# Patient Record
Sex: Female | Born: 1955
Health system: Southern US, Community
[De-identification: ages and names within clinical notes are randomized; demographics above are authoritative.]

## PROBLEM LIST (undated history)

## (undated) DIAGNOSIS — R7611 Nonspecific reaction to tuberculin skin test without active tuberculosis: Secondary | ICD-10-CM

## (undated) DIAGNOSIS — N83201 Unspecified ovarian cyst, right side: Secondary | ICD-10-CM

## (undated) DIAGNOSIS — J449 Chronic obstructive pulmonary disease, unspecified: Secondary | ICD-10-CM

## (undated) DIAGNOSIS — J329 Chronic sinusitis, unspecified: Secondary | ICD-10-CM

## (undated) DIAGNOSIS — I10 Essential (primary) hypertension: Secondary | ICD-10-CM

---

## 1998-07-02 ENCOUNTER — Other Ambulatory Visit: Admission: RE | Admit: 1998-07-02 | Discharge: 1998-07-02 | Payer: Self-pay | Admitting: *Deleted

## 2000-08-17 ENCOUNTER — Other Ambulatory Visit: Admission: RE | Admit: 2000-08-17 | Discharge: 2000-08-17 | Payer: Self-pay | Admitting: Internal Medicine

## 2000-10-06 ENCOUNTER — Encounter (INDEPENDENT_AMBULATORY_CARE_PROVIDER_SITE_OTHER): Payer: Self-pay | Admitting: Specialist

## 2000-10-06 ENCOUNTER — Other Ambulatory Visit: Admission: RE | Admit: 2000-10-06 | Discharge: 2000-10-06 | Payer: Self-pay | Admitting: *Deleted

## 2001-03-12 ENCOUNTER — Ambulatory Visit (HOSPITAL_COMMUNITY): Admission: RE | Admit: 2001-03-12 | Discharge: 2001-03-12 | Payer: Self-pay | Admitting: Internal Medicine

## 2001-03-12 ENCOUNTER — Encounter: Payer: Self-pay | Admitting: Internal Medicine

## 2001-09-30 ENCOUNTER — Other Ambulatory Visit: Admission: RE | Admit: 2001-09-30 | Discharge: 2001-09-30 | Payer: Self-pay | Admitting: *Deleted

## 2001-10-05 ENCOUNTER — Ambulatory Visit (HOSPITAL_COMMUNITY): Admission: RE | Admit: 2001-10-05 | Discharge: 2001-10-05 | Payer: Self-pay | Admitting: *Deleted

## 2001-10-27 ENCOUNTER — Encounter: Admission: RE | Admit: 2001-10-27 | Discharge: 2001-10-27 | Payer: Self-pay | Admitting: Infectious Diseases

## 2001-11-02 ENCOUNTER — Ambulatory Visit (HOSPITAL_COMMUNITY): Admission: RE | Admit: 2001-11-02 | Discharge: 2001-11-02 | Payer: Self-pay | Admitting: Infectious Diseases

## 2001-11-02 ENCOUNTER — Encounter: Payer: Self-pay | Admitting: Infectious Diseases

## 2001-11-10 ENCOUNTER — Ambulatory Visit: Admission: RE | Admit: 2001-11-10 | Discharge: 2001-11-10 | Payer: Self-pay | Admitting: Internal Medicine

## 2001-11-10 ENCOUNTER — Encounter (INDEPENDENT_AMBULATORY_CARE_PROVIDER_SITE_OTHER): Payer: Self-pay | Admitting: Specialist

## 2001-12-08 ENCOUNTER — Encounter: Admission: RE | Admit: 2001-12-08 | Discharge: 2001-12-08 | Payer: Self-pay | Admitting: Infectious Diseases

## 2002-01-19 ENCOUNTER — Encounter: Admission: RE | Admit: 2002-01-19 | Discharge: 2002-01-19 | Payer: Self-pay | Admitting: Infectious Diseases

## 2002-12-22 ENCOUNTER — Other Ambulatory Visit: Admission: RE | Admit: 2002-12-22 | Discharge: 2002-12-22 | Payer: Self-pay | Admitting: Internal Medicine

## 2006-03-03 ENCOUNTER — Ambulatory Visit: Payer: Self-pay | Admitting: Gastroenterology

## 2006-03-20 ENCOUNTER — Ambulatory Visit (HOSPITAL_COMMUNITY): Admission: RE | Admit: 2006-03-20 | Discharge: 2006-03-20 | Payer: Self-pay | Admitting: Obstetrics and Gynecology

## 2006-06-19 ENCOUNTER — Ambulatory Visit: Payer: Self-pay | Admitting: Gastroenterology

## 2006-09-28 ENCOUNTER — Other Ambulatory Visit: Admission: RE | Admit: 2006-09-28 | Discharge: 2006-09-28 | Payer: Self-pay | Admitting: Cardiology

## 2007-01-07 ENCOUNTER — Ambulatory Visit (HOSPITAL_COMMUNITY): Admission: RE | Admit: 2007-01-07 | Discharge: 2007-01-07 | Payer: Self-pay | Admitting: Internal Medicine

## 2008-08-11 ENCOUNTER — Ambulatory Visit (HOSPITAL_COMMUNITY): Admission: RE | Admit: 2008-08-11 | Discharge: 2008-08-11 | Payer: Self-pay | Admitting: Internal Medicine

## 2009-03-26 ENCOUNTER — Inpatient Hospital Stay (HOSPITAL_COMMUNITY): Admission: EM | Admit: 2009-03-26 | Discharge: 2009-03-27 | Payer: Self-pay | Admitting: Emergency Medicine

## 2009-05-03 ENCOUNTER — Ambulatory Visit: Admission: RE | Admit: 2009-05-03 | Discharge: 2009-05-03 | Payer: Self-pay | Admitting: Gynecologic Oncology

## 2009-05-04 ENCOUNTER — Other Ambulatory Visit: Payer: Self-pay | Admitting: Obstetrics and Gynecology

## 2010-02-14 ENCOUNTER — Other Ambulatory Visit: Admission: RE | Admit: 2010-02-14 | Discharge: 2010-02-14 | Payer: Self-pay | Admitting: Internal Medicine

## 2010-12-28 LAB — COMPREHENSIVE METABOLIC PANEL
ALT: 14 U/L (ref 0–35)
AST: 22 U/L (ref 0–37)
Albumin: 4.1 g/dL (ref 3.5–5.2)
Alkaline Phosphatase: 60 U/L (ref 39–117)
BUN: 9 mg/dL (ref 6–23)
CO2: 30 mEq/L (ref 19–32)
Calcium: 9.9 mg/dL (ref 8.4–10.5)
Chloride: 102 mEq/L (ref 96–112)
Creatinine, Ser: 0.78 mg/dL (ref 0.4–1.2)
GFR calc Af Amer: >60 mL/min (ref >60)
GFR calc non Af Amer: >60 mL/min (ref >60)
Glucose, Bld: 101 mg/dL — ABNORMAL HIGH (ref 70–99)
Potassium: 3.2 mEq/L — ABNORMAL LOW (ref 3.5–5.1)
Sodium: 138 mEq/L (ref 135–145)
Total Bilirubin: 1 mg/dL (ref 0.3–1.2)
Total Protein: 7.2 g/dL (ref 6.0–8.3)

## 2010-12-28 LAB — CBC
HCT: 43.6 % (ref 36.0–46.0)
Hemoglobin: 14.9 g/dL (ref 12.0–15.0)
MCHC: 34.2 g/dL (ref 30.0–36.0)
MCV: 93.7 fL (ref 78.0–100.0)
Platelets: 146 10*3/uL — ABNORMAL LOW (ref 150–400)
RBC: 4.65 MIL/uL (ref 3.87–5.11)
RDW: 12.8 % (ref 11.5–15.5)
WBC: 7.6 10*3/uL (ref 4.0–10.5)

## 2010-12-29 LAB — BASIC METABOLIC PANEL
BUN: 10 mg/dL (ref 6–23)
CO2: 24 mEq/L (ref 19–32)
Calcium: 8 mg/dL — ABNORMAL LOW (ref 8.4–10.5)
Chloride: 110 mEq/L (ref 96–112)
Creatinine, Ser: 0.68 mg/dL (ref 0.4–1.2)
GFR calc Af Amer: >60 mL/min (ref >60)
GFR calc non Af Amer: >60 mL/min (ref >60)
Glucose, Bld: 104 mg/dL — ABNORMAL HIGH (ref 70–99)
Potassium: 4 mEq/L (ref 3.5–5.1)
Sodium: 138 mEq/L (ref 135–145)

## 2010-12-29 LAB — DIFFERENTIAL
Basophils Absolute: 0.1 10*3/uL (ref 0.0–0.1)
Basophils Relative: 0 % (ref 0–1)
Eosinophils Absolute: 0.1 10*3/uL (ref 0.0–0.7)
Eosinophils Relative: 0 % (ref 0–5)
Lymphocytes Relative: 6 % — ABNORMAL LOW (ref 12–46)
Lymphs Abs: 1.4 10*3/uL (ref 0.7–4.0)
Monocytes Absolute: 0.8 10*3/uL (ref 0.1–1.0)
Monocytes Relative: 4 % (ref 3–12)
Neutro Abs: 21.6 10*3/uL — ABNORMAL HIGH (ref 1.7–7.7)
Neutrophils Relative %: 90 % — ABNORMAL HIGH (ref 43–77)

## 2010-12-29 LAB — URINE CULTURE
Colony Count: NO GROWTH
Culture: NO GROWTH

## 2010-12-29 LAB — CBC
HCT: 36.2 % (ref 36.0–46.0)
HCT: 47.8 % — ABNORMAL HIGH (ref 36.0–46.0)
Hemoglobin: 12.1 g/dL (ref 12.0–15.0)
Hemoglobin: 16.1 g/dL — ABNORMAL HIGH (ref 12.0–15.0)
MCHC: 33.5 g/dL (ref 30.0–36.0)
MCHC: 33.8 g/dL (ref 30.0–36.0)
MCV: 94.7 fL (ref 78.0–100.0)
MCV: 95.3 fL (ref 78.0–100.0)
Platelets: 144 10*3/uL — ABNORMAL LOW (ref 150–400)
Platelets: 243 10*3/uL (ref 150–400)
RBC: 3.79 MIL/uL — ABNORMAL LOW (ref 3.87–5.11)
RBC: 5.05 MIL/uL (ref 3.87–5.11)
RDW: 13 % (ref 11.5–15.5)
RDW: 13.2 % (ref 11.5–15.5)
WBC: 24.1 10*3/uL — ABNORMAL HIGH (ref 4.0–10.5)
WBC: 6.6 10*3/uL (ref 4.0–10.5)

## 2010-12-29 LAB — POCT I-STAT, CHEM 8
BUN: 7 mg/dL (ref 6–23)
Calcium, Ion: 1.11 mmol/L — ABNORMAL LOW (ref 1.12–1.32)
Chloride: 97 mEq/L (ref 96–112)
Creatinine, Ser: 0.8 mg/dL (ref 0.4–1.2)
Glucose, Bld: 100 mg/dL — ABNORMAL HIGH (ref 70–99)
HCT: 53 % — ABNORMAL HIGH (ref 36.0–46.0)
Hemoglobin: 18 g/dL — ABNORMAL HIGH (ref 12.0–15.0)
Potassium: 3 mEq/L — ABNORMAL LOW (ref 3.5–5.1)
Sodium: 136 mEq/L (ref 135–145)
TCO2: 31 mmol/L (ref 0–100)

## 2010-12-29 LAB — STREP A DNA PROBE: Group A Strep Probe: NEGATIVE

## 2010-12-29 LAB — WET PREP, GENITAL
Clue Cells Wet Prep HPF POC: NONE SEEN
Trich, Wet Prep: NONE SEEN
WBC, Wet Prep HPF POC: NONE SEEN

## 2010-12-29 LAB — COMPREHENSIVE METABOLIC PANEL
ALT: 17 U/L (ref 0–35)
AST: 29 U/L (ref 0–37)
Albumin: 4.2 g/dL (ref 3.5–5.2)
Alkaline Phosphatase: 76 U/L (ref 39–117)
BUN: 7 mg/dL (ref 6–23)
CO2: 30 mEq/L (ref 19–32)
Calcium: 9.7 mg/dL (ref 8.4–10.5)
Chloride: 99 mEq/L (ref 96–112)
Creatinine, Ser: 0.72 mg/dL (ref 0.4–1.2)
GFR calc Af Amer: >60 mL/min (ref >60)
GFR calc non Af Amer: >60 mL/min (ref >60)
Glucose, Bld: 104 mg/dL — ABNORMAL HIGH (ref 70–99)
Potassium: 3.2 mEq/L — ABNORMAL LOW (ref 3.5–5.1)
Sodium: 138 mEq/L (ref 135–145)
Total Bilirubin: 1 mg/dL (ref 0.3–1.2)
Total Protein: 7.8 g/dL (ref 6.0–8.3)

## 2010-12-29 LAB — PROTIME-INR
INR: 0.9 (ref 0.00–1.49)
Prothrombin Time: 12.6 seconds (ref 11.6–15.2)

## 2010-12-29 LAB — LIPASE, BLOOD: Lipase: 32 U/L (ref 11–59)

## 2010-12-29 LAB — URINALYSIS, ROUTINE W REFLEX MICROSCOPIC
Bilirubin Urine: NEGATIVE
Glucose, UA: NEGATIVE mg/dL
Hgb urine dipstick: NEGATIVE
Ketones, ur: NEGATIVE mg/dL
Nitrite: NEGATIVE
Protein, ur: NEGATIVE mg/dL
Specific Gravity, Urine: 1.009 (ref 1.005–1.030)
Urobilinogen, UA: 0.2 mg/dL (ref 0.0–1.0)
pH: 7 (ref 5.0–8.0)

## 2010-12-29 LAB — GC/CHLAMYDIA PROBE AMP, GENITAL
Chlamydia, DNA Probe: NEGATIVE
GC Probe Amp, Genital: NEGATIVE

## 2010-12-29 LAB — RAPID STREP SCREEN (MED CTR MEBANE ONLY): Streptococcus, Group A Screen (Direct): NEGATIVE

## 2010-12-29 LAB — MAGNESIUM: Magnesium: 2.1 mg/dL (ref 1.5–2.5)

## 2010-12-29 LAB — PREGNANCY, URINE: Preg Test, Ur: NEGATIVE

## 2011-02-04 NOTE — Consult Note (Signed)
NAME:  Stephanie [name], Stephanie [name] NO.:  [id number]   MEDICAL RECORD NO.:  [id number]          PATIENT TYPE:  OUT   LOCATION:  GYN                          FACILITY:  [facility]   PHYSICIAN:  Laurette Schimke, MD     DATE OF BIRTH:  Nov 13, 1955   DATE OF CONSULTATION:  DATE OF DISCHARGE:                                 CONSULTATION   REASON FOR VISIT:  A request was made by Dr. Arelia Sneddon for Ms. Arijana Narayan to be seen in consultation for a pelvic mass.   HISTORY OF PRESENT ILLNESS:  This is a 55 year old gravida 2, para 2.  Last normal menstrual period March 31, 2009.  Stephanie [name] experienced very  severe crampy lower abdominal pain which she says diffusely involved the  stomach, was associated with nausea, a significant amount of clamminess  and the urge to vomit.  She presented to the emergency room, at which  time a CT scan was obtained.  The CT scan was obtained which showed a  right large cystic lesion measuring 5.6 x 4.01, and a left ovarian cyst  measuring 19 x 15 mm.  The loops of the bowel were remarkable.  No other  intrapelvic abnormalities were noted.  An ultrasound was obtained that  same visit which showed that the left ovary had a simple ovarian cyst,  and there was a possible hemorrhagic cyst or endometrium of the right  ovary.  Stephanie [name] reports an 8 pounds weight loss over the last year.  She states that her appetite is poor.  This is in large part associated  with her irregular work schedule.  She denies any diarrhea or  constipation.  There is no early satiety or bloating.  There is no  shortness of breath.   PAST MEDICAL HISTORY:  Hypertension of 6 years duration.   PAST SURGICAL HISTORY:  None.   PAST GYNECOLOGIC HISTORY:  Menarche occurred at the age of 73 with  regular menses until recently when it became irregular.  She reports a  history of Depo-Provera use for 4 years' duration, but she stopped using  it 5 years ago.   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  She works the second shift.  She reports one  pack of tobacco use daily for the last 3 years.   MEDICATIONS:  1. Maxzide.  2. Potassium.   FAMILY HISTORY:  Father had a benign breast cyst.  Sister also with a  benign breast cyst.  She denies any gynecologic, color or other  malignancies.   REVIEW OF SYSTEMS:  A 10-poing review of systems was performed and  noteworthy only for those points mentioned in the history of present  illness.   PHYSICAL EXAMINATION:  VITAL SIGNS:  Weight 99 pounds, height 4 feet 11  inches, blood pressure 120/66.  CHEST:  Clear to auscultation bilaterally.  HEART:  Regular rate and rhythm.  ABDOMEN:  Soft, nontender, nondistended.  No omental cake.  No fluid  wave.  PELVIC:  Normal-appearing genitalia, Bartholin's urethra, Skene's.  Uterus was retroverted.  There is fullness in the left the area of the  pelvis.   IMPRESSION:  Pelvic mass.  This is a 55 year old who presented with  symptoms which are not inconsistent with ovarian torsion who was on the  first day of menses.  A CA-125 was obtained, which was minimally  elevated at a level of 37.6 (upper level of normal per this lab is  30.2).  I suggested to Ms. [name] that perhaps a repeat ultrasound could  be obtained, and if the mass is still present, then laparoscopic  salpingo-oophorectomy could be indicated.  It is approximately 6 weeks  since the incident, so this should represent a different phase of the  menstrual cycle.  Stephanie [name] informs me that she is scheduled for an  ultrasound on May 04, 2009, but there was also the plan for surgical  intervention on Monday, May 07, 2009, since she wishes definitive  management.      Laurette Schimke, MD  Electronically Signed     WB/MEDQ  D:  05/03/2009  T:  05/03/2009  Job:  [id number]   cc:   Telford Nab, R.N.  [address]  [place], [place] [address]   Juluis Mire,  M.D.  Fax: [phone]

## 2011-02-04 NOTE — H&P (Signed)
NAME:  Stephanie [name], Stephanie [name] NO.:  [id number]   MEDICAL RECORD NO.:  [id number]          PATIENT TYPE:  EMS   LOCATION:  ED                           FACILITY:  [facility]   PHYSICIAN:  Vania Rea, M.D. DATE OF BIRTH:  [birth year]   DATE OF ADMISSION:  03/26/2009  DATE OF DISCHARGE:                              HISTORY & PHYSICAL   PRIMARY CARE PHYSICIAN:  Dr. Georgianne Fick   GYNECOLOGIST:  Dr. Richardean Chimera   CHIEF COMPLAINT:  Abdominal pain.   HISTORY OF PRESENT ILLNESS:  This is a 55 year old Asian lady who was  out in the mall when she was suddenly overcome by 10/10 periumbilical  pain which did not radiate.  There was no associated nausea or vomiting.  She is sexually active, menstruation is regular and her menstruation  started the day prior to onset of the abdominal pain.  She is having no  vaginal discharge and no dyspareunia and no dysmenorrhea.  The patient  came to the emergency room and is extensively evaluated for pelvic pain  including CT scans of the abdomen and pelvis, ultrasound of the abdomen  and pelvis, and no clear cause for the patient's pain could be  identified.  Additionally, the patient was found to have white count of  24,000 with 90% neutrophils, which could not be explained and the  hospitalist service was called to assist with management.   On further questioning, the patient admits that she has been having a  sore throat and a postnasal drip and she has been taking Ceftin 500 mg  twice daily, now for about 5-1/2 days.  She has been having no fever.  Throat is improving.  For the 9 hours the patient has been in the  emergency room, her abdominal pain has improved significantly.   PAST MEDICAL HISTORY:  Hypertension.   MEDICATIONS:  Maxzide one daily, potassium chloride 20 mEq daily, Ceftin  500 mg twice daily for the past 5 days.   ALLERGIES:  NO KNOWN DRUG ALLERGIES.   SOCIAL HISTORY:  She smokes one-pack per day.  Denies  alcohol or illicit  drug use.   REVIEW OF SYSTEMS:  Other than noted above, 10-point review of systems  is unremarkable.   PHYSICAL EXAMINATION:  GENERAL:  A healthy-looking Asian lady lying in  bed in no acute distress.  VITALS:  Temperature is 98.2, pulse 80, respirations 18, blood pressure  120/77.  She is saturating at 98% on room air.  HEENT:  Her pupils are round, equal and reactive.  Mucous membranes pink  and anicteric.  No cervical lymphadenopathy or thyromegaly.  She has no  oral Candida.  She has a creamy white exudate over her tonsils and  pharynx.  Her chest is clear to auscultation bilaterally.  CARDIOVASCULAR SYSTEM:  Regular rhythm.  No murmur.  ABDOMEN:  Scaphoid and soft.  She has tenderness to deep palpation in  the right lower quadrant and the left anterior flank  EXTREMITIES:  Without edema.  She has 2+ dorsalis pedis pulses  bilaterally.  SKIN:  Warm and dry without ulceration.  CENTRAL NERVOUS  SYSTEM: Cranial nerves II-XII are grossly intact.  She  has no focal neurologic deficit.   LABORATORY DATA:  White count 24.1, hemoglobin 16.1, platelets 243.  She  had 9% neutrophils.  Absolute neutrophil count was 21.6.  Sodium is 138,  potassium 3.2, chloride 99, pO2 30, glucose 104, BUN 7, creatinine 0.7.  Liver functions are completely normal.  Lipase is normal.  Her coags are  normal.  Urinalysis is completely normal.  Pregnancy test is negative.  Wet prep from her vaginal smear shows many yeast.  No Trichomonas.  No  clue cells, no Y cells.  CT scan of the abdomen and pelvis revealed  minimal patchiness of the right renal cortex on delayed images, could be  due to is subtle pyelonephritis, recommend correlation with urinalysis.  CT scan of the pelvis shows bilateral adnexal cystic structures, likely  ovarian cyst, no intrapelvic abnormality identified.  Ultrasound of her  abdomen shows simple cyst of the left ovary, questions hemorrhagic cyst,  although  potentially endometrioma of the right ovary.  Recommend  ultrasound after two menstrual cycles to exclude ovarian tumor or  neoplasm.   ASSESSMENT:  1. Leukocytosis probably due to incompletely treated pharyngitis and      sinusitis, possibly also due to incompletely treated pyelonephritis      for which could be causing negative urinalysis.  2. Pelvic and abdominal pain of unclear etiology.  3. Hypokalemia related to diuretic use.  4. Hypertension controlled.  5. History of change in stool pattern with a planned colonoscopy in      one month.   PLAN:  1. Will admit this lady for intravenous antibiotics therapy to cover      her pharyngitis and presumed pyelonephritis, incompletely treated.  2. Will replete her potassium.  3. The patient can likely be quickly transitioned to oral antibiotics      and discharged to follow-up with her gynecologist or primary care      physician's.  4. Other plans as per orders.      Vania Rea, M.D.  Electronically Signed     LC/MEDQ  D:  03/26/2009  T:  03/26/2009  Job:  [id number]   cc:   Rhae Lerner. Margretta Ditty, M.D.  [address]  [place]  [place] [address]   Deanna Artis, MD   Juluis Mire, M.D.  Fax: [phone]   Georgianne Fick, M.D.  Fax: [phone]

## 2011-02-04 NOTE — Discharge Summary (Signed)
NAMEYULI, [name]              ACCOUNT NO.:  [id number]   MEDICAL RECORD NO.:  [id number]          PATIENT TYPE:  INP   LOCATION:  1308                         [facility]AC[facility]:  [facility]   PHYSICIAN:  Peggye Pitt, M.D. DATE OF BIRTH:  [birth year]   DATE OF ADMISSION:  03/25/2009  DATE OF DISCHARGE:  03/27/2009                               DISCHARGE SUMMARY   DISCHARGE DIAGNOSES:  1. Abdominal pain, resolved.  2. Right ovarian cyst, possibly hemorrhagic.  3. Leukocytosis, resolved.  4. Acute sinusitis.  5. Hypertension.   DISCHARGE MEDICATIONS:  1. Amoxicillin 500 mg twice daily for 7 days.  2. Maxzide 1 tablet daily.  3. KCl 20 mEq daily.   DISPOSITION/FOLLOWUP:  [name] is discharged home today in stable  condition.  She is instructed to call her gynecologist, Dr. Arelia Sneddon for a  follow-up appointment.   CONSULTATIONS THIS HOSPITALIZATION:  None.   IMAGES AND PROCEDURES:  1. An abdominal x-ray on March 25, 2009 that showed minimal ileus.  2. A CT scan of the abdomen and pelvis with contrast on March 26, 2009      that showed bilateral adnexal cystic structures, 1.9 cm on the      left, and 5.6 cm on the right, likely ovarian cyst, minimal      dependent free pelvic fluid.  3. A transvaginal ultrasound that showed a small simple cyst of the      left ovary, a question hemorrhagic cyst or potentially endometrioma      of the right ovary, favor hemorrhagic cyst, recommend follow-up      ultrasound after two menstrual cycles to exclude ovarian      tumor/neoplasm.   HISTORY AND PHYSICAL EXAM:  For full details please see dictation by Dr.  Orvan Falconer on March 26, 2009, but in brief, Stephanie [name] is a pleasant 55-year-  old woman of a Filipino ethnicity who was at the mall when she suddenly  had periumbilical abdominal pain that did not radiate. She denies  dyspareunia and dysmenorrhea, is not having any vaginal discharge.  In  the emergency room a CT scan of the abdomen and pelvis  showed possibly a  right ovarian cyst.  She was also found to have a leukocytosis of 24,000  and hence we were called to admit for further evaluation and management.   HOSPITAL COURSE:  1. Her abdominal pain.  This has currently resolved.  We have found no      cause for it other than possibly her right ovarian cyst, although I      doubt this would cause a periumbilical pain.  Her appendix was      normal on CT scan.  2. Leukocytosis which has now resolved.  Her WBC count is 6.6 today.      I have question if this was a lab error versus possibly her acute      sinusitis.  3. For her acute sinusitis, she has very erythematous oropharynx and      has lot of nasal congestion.  Hence, I will give her amoxicillin to      complete  a 7-day course.  4. Hypertension.  She has maintained excellent blood pressure control      while in the hospital.  5. Vital signs on day of discharge; blood pressure 108/67, heart rate      56, respirations 20, O2 sats 98% on room air with a temperature of      98.2.  6. Labs on day of discharge; sodium 138, potassium 4.0, chloride 110,      bicarb 24, BUN 10, creatinine 0.68, glucose of 104. A magnesium of      2.1. WBC 6.6, hemoglobin 12.1, platelet count of 144. Please note      that both rapid strep test and a group-A throat probe have been      negative.  Mrs. Mooradian also had a pelvic exam in the emergency      department with a wet prep that showed many yeast. She has received      2 days of Diflucan while in the hospital.      Peggye Pitt, M.D.  Electronically Signed     EH/MEDQ  D:  03/27/2009  T:  03/27/2009  Job:  [id number]   cc:   Juluis Mire, M.D.  Fax: [phone]

## 2011-02-07 NOTE — Procedures (Signed)
[place]  Patient:    Stephanie [name], Stephanie [name] Visit Number: [id number] MRN: [id number]          Service Type: OUT Location: RAD Attending Physician:  Noland Fordyce Dictated by:   Charlaine Dalton. Sherene Sires, M.D. [facility] Proc. Date: 11/10/01 Admit Date:  10/05/2001 Discharge Date: 10/05/2001                             Procedure Report  PROCEDURE PERFORMED:  Fiberoptic bronchoscopy with transbronchial biopsy of the right upper lobe.  REFERRING PHYSICIAN:  Lacretia Leigh. Ninetta Lights, M.D.  ENDOSCOPIST:  Charlaine Dalton. Wert, M.D. [facility]  INDICATIONS FOR PROCEDURE:  This is a 55 year old philippina female with new conversion, positive PPD conversion and a vague infiltrate of her right upper lobe, nodular infiltration of her right upper lobe.  It is barely visible on chest x-ray and more prominent on CT scan.  The differential diagnosis is between malignancy and tuberculosis.  The patient was not producing any significant sputum and therefore bronchoscopy is indicated to obtain tissue as well as lavage for AFB, fungal stain and culture.  The patient agreed to the procedure after a full discussion of the risks, benefits and alternatives.  She received a total of 5 mg of IV Versed and 25 mg of IV Demerol with adequate sedation and cough suppression while being continuously monitored by surface echocardiogram and oximetry.  She maintained greater than 90% saturation throughout the procedure on nasal oxygen by nasal prongs.  The right naris and oropharynx were liberally anesthetized with 1% lidocaine aerosol.  The right naris was additionally prepared with 2% lidocaine jelly.  Using a standard flexible fiberoptic video bronchoscope, the right naris was easily cannulated with good visualization of the entire oropharynx and larynx. The cords moved normally and there were no apparent upper airway lesions.  Using additional 1% lidocaine as needed, the entire tracheobronchial tree  was explored bilaterally with the following findings.  (1) Trachea, carina and all the airways opened widely to the subsegmental level with no evidence of significant abnormalities.  There was mild dynamic compression of the airways distally but no evidence of endobronchial lesions or excessive secretions or inflammation.  Therefore, using a wedge position within the right upper lobe, several subsegments of the right upper lobe, I was able to access the tissue, sampling the tissue but there was no infiltrate to be seen on fluoroscopy.  From the area of infiltrate corresponded to the CT scan.  There was minimal bleeding encountered.  Additionally, the right upper lobe was generously lavaged with adequate return.  IMPRESSION:  Nodular density right upper lobe, question etiology.  Will await the histology and special stains as indicated.  Additionally, the right upper lobe was lavaged for cytology, acid fast bacillus and fungal stain and culture.  The patient tolerated the procedure well.  Follow-up chest x-ray showed no evidence of pneumothorax but increased alveolar markings (probably consistent with the effects of biopsy with alveolar hemorrhage).  The patient will be observed until she is saturating well on room air and then discharged home. Dictated by:   Charlaine Dalton. Sherene Sires, M.D. [facility] Attending Physician:  Noland Fordyce DD:  11/10/01 TD:  11/10/01 Job: [id number]

## 2011-11-26 ENCOUNTER — Other Ambulatory Visit (HOSPITAL_COMMUNITY): Payer: Self-pay | Admitting: Obstetrics and Gynecology

## 2011-11-26 DIAGNOSIS — Z139 Encounter for screening, unspecified: Secondary | ICD-10-CM

## 2011-12-02 ENCOUNTER — Ambulatory Visit (HOSPITAL_COMMUNITY): Payer: Self-pay

## 2011-12-04 ENCOUNTER — Ambulatory Visit (HOSPITAL_COMMUNITY)
Admission: RE | Admit: 2011-12-04 | Discharge: 2011-12-04 | Disposition: A | Discharge status: Home / Self Care | Payer: Managed Care, Other (non HMO) | Source: Ambulatory Visit | Attending: Obstetrics and Gynecology | Admitting: Obstetrics and Gynecology

## 2011-12-04 DIAGNOSIS — Z1231 Encounter for screening mammogram for malignant neoplasm of breast: Secondary | ICD-10-CM | POA: Insufficient documentation

## 2011-12-04 DIAGNOSIS — Z139 Encounter for screening, unspecified: Secondary | ICD-10-CM

## 2012-02-04 ENCOUNTER — Other Ambulatory Visit: Payer: Self-pay | Admitting: Obstetrics and Gynecology

## 2014-08-09 ENCOUNTER — Other Ambulatory Visit: Payer: Self-pay | Admitting: Obstetrics and Gynecology

## 2014-08-14 LAB — CYTOLOGY - PAP

## 2019-02-10 ENCOUNTER — Other Ambulatory Visit: Payer: Self-pay | Admitting: Internal Medicine

## 2019-02-10 DIAGNOSIS — N83209 Unspecified ovarian cyst, unspecified side: Secondary | ICD-10-CM

## 2019-03-07 ENCOUNTER — Ambulatory Visit
Admission: RE | Admit: 2019-03-07 | Discharge: 2019-03-07 | Disposition: A | Discharge status: Home / Self Care | Payer: 59 | Source: Ambulatory Visit | Attending: Internal Medicine | Admitting: Internal Medicine

## 2019-03-07 DIAGNOSIS — N83209 Unspecified ovarian cyst, unspecified side: Secondary | ICD-10-CM

## 2019-08-22 ENCOUNTER — Other Ambulatory Visit: Payer: Self-pay

## 2019-08-22 ENCOUNTER — Encounter (HOSPITAL_BASED_OUTPATIENT_CLINIC_OR_DEPARTMENT_OTHER): Payer: Self-pay | Admitting: *Deleted

## 2019-08-22 NOTE — Progress Notes (Addendum)
Spoke w/ via phone for pre-op interview---Stephanie [name] needs dos----    Cbc, bmet, ekg          [name] results------ COVID test ------+ covid test 05-30-2019 [place] home, results on chart Arrive at -------530 am NPO after ------midnight Medications to take morning of surgery -----none Diabetic medication -----n/a Patient Special Instructions ----- Pre-Op special Istructions ----- Patient verbalized understanding of instructions that were given at this phone interview. Patient denies shortness of breath, chest pain, fever, cough a this phone interview.

## 2019-08-23 ENCOUNTER — Other Ambulatory Visit (HOSPITAL_COMMUNITY)
Admission: RE | Admit: 2019-08-23 | Discharge: 2019-08-23 | Disposition: A | Discharge status: Home / Self Care | Payer: 59 | Source: Ambulatory Visit | Attending: Obstetrics and Gynecology | Admitting: Obstetrics and Gynecology

## 2019-08-24 NOTE — H&P (Signed)
Stephanie [name] is an 63 y.o. female. She had a pelvic ultrasound in June to f/u an ovarian cyst seen in 2010.  Ultrasound with 6 cm complex cyst of right ovary-so significant change from 10 years prior, Ca-125 elevated to 73.2.  Ultrasound also with possible endometrial polyp.  She has no significant pain from this cyst, no recent vaginal bleeding.  Due to the size of the cyst and elevated Ca-125, I have recommended the ovary be removed although likely benign.  Pertinent Gynecological History: Last mammogram: normal Date: 04/2019 Last pap: ASCUS, neg HPV Date: 2017 OB History: G2, P2002 SVD x 2   Menstrual History: No LMP recorded. Patient is postmenopausal.    Past Medical History:  Diagnosis Date  . COPD (chronic obstructive pulmonary disease) ([place])    quit smoking 8 yrs ago  . Hypertension   . Positive TB test many yrs ago  . Right ovarian cyst   . Sinusitis    occ    Past Surgical History:  Procedure Laterality Date  . bronscopy  2000  . colonscopy  2013    History reviewed. No pertinent family history.  Social History:  reports that she has quit smoking. Her smoking use included cigarettes. She has a 50.00 pack-year smoking history. She has never used smokeless tobacco. She reports current alcohol use. She reports that she does not use drugs.  Allergies: No Known Allergies  No medications prior to admission.    Review of Systems  Respiratory: Negative.   Cardiovascular: Negative.     Height 5' (1.524 m), weight 48.5 kg. Physical Exam  Constitutional: She appears well-developed and well-nourished.  Neck: Neck supple. No thyromegaly present.  Cardiovascular: Normal rate, regular rhythm and normal heart sounds.  Respiratory: Effort normal and breath sounds normal. No respiratory distress. She has no wheezes.  GI: Soft. She exhibits no distension and no mass. There is no abdominal tenderness.  Genitourinary:    Vagina and uterus normal.     Genitourinary Comments:  Right adnexal fullness     No results found for this or any previous visit (from the past 24 hour(s)).  No results found.  Assessment/Plan: 6 cm complex right ovarian cyst, stable over 10 years by u/s, but with elevated Ca-125, also with probable endometrial polyp.  Discussed options, recommended surgical removal of ovary and hysteroscopy and possible polypectomy.  Surgical procedure and risks discussed.  Will admit for open laparoscopy, RSO, hysteroscopy and possible polypectomy.  Blane Ohara Kadajah Kjos 08/24/2019, 5:44 PM

## 2019-08-26 ENCOUNTER — Ambulatory Visit (HOSPITAL_BASED_OUTPATIENT_CLINIC_OR_DEPARTMENT_OTHER): Payer: 59 | Admitting: Anesthesiology

## 2019-08-26 ENCOUNTER — Other Ambulatory Visit: Payer: Self-pay

## 2019-08-26 ENCOUNTER — Ambulatory Visit (HOSPITAL_BASED_OUTPATIENT_CLINIC_OR_DEPARTMENT_OTHER)
Admission: RE | Admit: 2019-08-26 | Discharge: 2019-08-26 | Disposition: A | Discharge status: Home / Self Care | Payer: 59 | Attending: Obstetrics and Gynecology | Admitting: Obstetrics and Gynecology

## 2019-08-26 ENCOUNTER — Encounter (HOSPITAL_BASED_OUTPATIENT_CLINIC_OR_DEPARTMENT_OTHER)
Admission: RE | Disposition: A | Discharge status: Home / Self Care | Payer: Self-pay | Source: Home / Self Care | Attending: Obstetrics and Gynecology

## 2019-08-26 ENCOUNTER — Encounter (HOSPITAL_BASED_OUTPATIENT_CLINIC_OR_DEPARTMENT_OTHER): Payer: Self-pay | Admitting: *Deleted

## 2019-08-26 DIAGNOSIS — I10 Essential (primary) hypertension: Secondary | ICD-10-CM | POA: Insufficient documentation

## 2019-08-26 DIAGNOSIS — K660 Peritoneal adhesions (postprocedural) (postinfection): Secondary | ICD-10-CM | POA: Insufficient documentation

## 2019-08-26 DIAGNOSIS — D270 Benign neoplasm of right ovary: Secondary | ICD-10-CM | POA: Diagnosis not present

## 2019-08-26 DIAGNOSIS — N83201 Unspecified ovarian cyst, right side: Secondary | ICD-10-CM | POA: Diagnosis present

## 2019-08-26 DIAGNOSIS — Z87891 Personal history of nicotine dependence: Secondary | ICD-10-CM | POA: Insufficient documentation

## 2019-08-26 DIAGNOSIS — J449 Chronic obstructive pulmonary disease, unspecified: Secondary | ICD-10-CM | POA: Insufficient documentation

## 2019-08-26 HISTORY — DX: Essential (primary) hypertension: I10

## 2019-08-26 HISTORY — PX: LAPAROSCOPIC UNILATERAL SALPINGO OOPHERECTOMY: SHX5935

## 2019-08-26 HISTORY — PX: HYSTEROSCOPY: SHX211

## 2019-08-26 HISTORY — DX: Chronic sinusitis, unspecified: J32.9

## 2019-08-26 HISTORY — DX: Unspecified ovarian cyst, right side: N83.201

## 2019-08-26 HISTORY — DX: Chronic obstructive pulmonary disease, unspecified: J44.9

## 2019-08-26 HISTORY — DX: Nonspecific reaction to tuberculin skin test without active tuberculosis: R76.11

## 2019-08-26 LAB — CBC
HCT: 45.6 % (ref 36.0–46.0)
Hemoglobin: 14.7 g/dL (ref 12.0–15.0)
MCH: 28.9 pg (ref 26.0–34.0)
MCHC: 32.2 g/dL (ref 30.0–36.0)
MCV: 89.8 fL (ref 80.0–100.0)
Platelets: 196 10*3/uL (ref 150–400)
RBC: 5.08 MIL/uL (ref 3.87–5.11)
RDW: 12.8 % (ref 11.5–15.5)
WBC: 5.9 10*3/uL (ref 4.0–10.5)
nRBC: 0 % (ref 0.0–0.2)

## 2019-08-26 LAB — BASIC METABOLIC PANEL
Anion gap: 15 (ref 5–15)
BUN: 17 mg/dL (ref 8–23)
CO2: 27 mmol/L (ref 22–32)
Calcium: 9.8 mg/dL (ref 8.9–10.3)
Chloride: 98 mmol/L (ref 98–111)
Creatinine, Ser: 0.74 mg/dL (ref 0.44–1.00)
GFR calc Af Amer: >60 mL/min (ref >60)
GFR calc non Af Amer: >60 mL/min (ref >60)
Glucose, Bld: 107 mg/dL — ABNORMAL HIGH (ref 70–99)
Potassium: 3 mmol/L — ABNORMAL LOW (ref 3.5–5.1)
Sodium: 140 mmol/L (ref 135–145)

## 2019-08-26 SURGERY — SALPINGO-OOPHORECTOMY, UNILATERAL, LAPAROSCOPIC
Anesthesia: General | Site: Uterus | Laterality: Right

## 2019-08-26 MED ORDER — BUPIVACAINE HCL (PF) 0.25 % IJ SOLN
INTRAMUSCULAR | Status: DC | PRN
  Administered 2019-08-26: 7 mL

## 2019-08-26 MED ORDER — KETOROLAC TROMETHAMINE 30 MG/ML IJ SOLN
30.0000 mg | Freq: Once | INTRAMUSCULAR | Status: DC | PRN
  Filled 2019-08-26: qty 1

## 2019-08-26 MED ORDER — ONDANSETRON HCL 4 MG/2ML IJ SOLN
INTRAMUSCULAR | Status: DC | PRN
  Administered 2019-08-26 (×2): 4 mg via INTRAVENOUS

## 2019-08-26 MED ORDER — ROCURONIUM BROMIDE 10 MG/ML (PF) SYRINGE
PREFILLED_SYRINGE | INTRAVENOUS | Status: AC
  Filled 2019-08-26: qty 10

## 2019-08-26 MED ORDER — MIDAZOLAM HCL 2 MG/2ML IJ SOLN
INTRAMUSCULAR | Status: DC | PRN
  Administered 2019-08-26: 1 mg via INTRAVENOUS

## 2019-08-26 MED ORDER — DEXAMETHASONE SODIUM PHOSPHATE 10 MG/ML IJ SOLN
INTRAMUSCULAR | Status: AC
  Filled 2019-08-26: qty 1

## 2019-08-26 MED ORDER — SCOPOLAMINE 1 MG/3DAYS TD PT72
MEDICATED_PATCH | TRANSDERMAL | Status: AC
  Filled 2019-08-26: qty 1

## 2019-08-26 MED ORDER — DEXAMETHASONE SODIUM PHOSPHATE 10 MG/ML IJ SOLN
INTRAMUSCULAR | Status: DC | PRN
  Administered 2019-08-26: 8 mg via INTRAVENOUS

## 2019-08-26 MED ORDER — HYDROMORPHONE HCL 1 MG/ML IJ SOLN
INTRAMUSCULAR | Status: AC
  Filled 2019-08-26: qty 1

## 2019-08-26 MED ORDER — PROMETHAZINE HCL 25 MG/ML IJ SOLN
6.2500 mg | INTRAMUSCULAR | Status: DC | PRN
  Filled 2019-08-26: qty 1

## 2019-08-26 MED ORDER — FENTANYL CITRATE (PF) 100 MCG/2ML IJ SOLN
INTRAMUSCULAR | Status: DC | PRN
  Administered 2019-08-26: 100 ug via INTRAVENOUS

## 2019-08-26 MED ORDER — PROPOFOL 10 MG/ML IV BOLUS
INTRAVENOUS | Status: DC | PRN
  Administered 2019-08-26: 150 mg via INTRAVENOUS

## 2019-08-26 MED ORDER — SUGAMMADEX SODIUM 200 MG/2ML IV SOLN
INTRAVENOUS | Status: DC | PRN
  Administered 2019-08-26: 200 mg via INTRAVENOUS

## 2019-08-26 MED ORDER — ROCURONIUM BROMIDE 10 MG/ML (PF) SYRINGE
PREFILLED_SYRINGE | INTRAVENOUS | Status: DC | PRN
  Administered 2019-08-26: 50 mg via INTRAVENOUS

## 2019-08-26 MED ORDER — ARTIFICIAL TEARS OPHTHALMIC OINT
TOPICAL_OINTMENT | OPHTHALMIC | Status: AC
  Filled 2019-08-26: qty 3.5

## 2019-08-26 MED ORDER — KETOROLAC TROMETHAMINE 30 MG/ML IJ SOLN
INTRAMUSCULAR | Status: AC
  Filled 2019-08-26: qty 1

## 2019-08-26 MED ORDER — PHENYLEPHRINE HCL (PRESSORS) 10 MG/ML IV SOLN
INTRAVENOUS | Status: DC | PRN
  Administered 2019-08-26 (×2): 80 ug via INTRAVENOUS

## 2019-08-26 MED ORDER — MEPERIDINE HCL 25 MG/ML IJ SOLN
6.2500 mg | INTRAMUSCULAR | Status: DC | PRN
  Filled 2019-08-26: qty 1

## 2019-08-26 MED ORDER — PROPOFOL 10 MG/ML IV BOLUS
INTRAVENOUS | Status: AC
  Filled 2019-08-26: qty 40

## 2019-08-26 MED ORDER — TRAMADOL HCL 50 MG PO TABS: 50.0000 mg | ORAL_TABLET | Freq: Four times a day (QID) | ORAL | 0 refills | Status: DC | PRN

## 2019-08-26 MED ORDER — ACETAMINOPHEN 500 MG PO TABS
ORAL_TABLET | ORAL | Status: AC
  Filled 2019-08-26: qty 2

## 2019-08-26 MED ORDER — SODIUM CHLORIDE 0.9 % IR SOLN
Status: DC | PRN
  Administered 2019-08-26: 3000 mL

## 2019-08-26 MED ORDER — ACETAMINOPHEN 500 MG PO TABS
1000.0000 mg | ORAL_TABLET | Freq: Once | ORAL | Status: AC
  Administered 2019-08-26: 1000 mg via ORAL
  Filled 2019-08-26: qty 2

## 2019-08-26 MED ORDER — LIDOCAINE 2% (20 MG/ML) 5 ML SYRINGE
INTRAMUSCULAR | Status: AC
  Filled 2019-08-26: qty 5

## 2019-08-26 MED ORDER — OXYCODONE HCL 5 MG/5ML PO SOLN
5.0000 mg | Freq: Once | ORAL | Status: DC | PRN
  Filled 2019-08-26: qty 5

## 2019-08-26 MED ORDER — KETOROLAC TROMETHAMINE 30 MG/ML IJ SOLN
INTRAMUSCULAR | Status: DC | PRN
  Administered 2019-08-26: 30 mg via INTRAVENOUS

## 2019-08-26 MED ORDER — LACTATED RINGERS IV SOLN
INTRAVENOUS | Status: DC
  Filled 2019-08-26: qty 1000

## 2019-08-26 MED ORDER — LIDOCAINE 2% (20 MG/ML) 5 ML SYRINGE
INTRAMUSCULAR | Status: DC | PRN
  Administered 2019-08-26: 40 mg via INTRAVENOUS

## 2019-08-26 MED ORDER — ONDANSETRON HCL 4 MG/2ML IJ SOLN
INTRAMUSCULAR | Status: AC
  Filled 2019-08-26: qty 2

## 2019-08-26 MED ORDER — HYDROMORPHONE HCL 1 MG/ML IJ SOLN
0.2500 mg | INTRAMUSCULAR | Status: DC | PRN
  Administered 2019-08-26: 0.25 mg via INTRAVENOUS
  Filled 2019-08-26: qty 0.5

## 2019-08-26 MED ORDER — OXYCODONE HCL 5 MG PO TABS
5.0000 mg | ORAL_TABLET | Freq: Once | ORAL | Status: DC | PRN
  Filled 2019-08-26: qty 1

## 2019-08-26 MED ORDER — SCOPOLAMINE 1 MG/3DAYS TD PT72
1.0000 | MEDICATED_PATCH | TRANSDERMAL | Status: DC
  Administered 2019-08-26: 1.5 mg via TRANSDERMAL
  Filled 2019-08-26: qty 1

## 2019-08-26 MED ORDER — LACTATED RINGERS IV SOLN
INTRAVENOUS | Status: DC
  Administered 2019-08-26 (×2): via INTRAVENOUS
  Filled 2019-08-26: qty 1000

## 2019-08-26 MED ORDER — FENTANYL CITRATE (PF) 100 MCG/2ML IJ SOLN
INTRAMUSCULAR | Status: AC
  Filled 2019-08-26: qty 2

## 2019-08-26 MED ORDER — MIDAZOLAM HCL 2 MG/2ML IJ SOLN
INTRAMUSCULAR | Status: AC
  Filled 2019-08-26: qty 2

## 2019-08-26 SURGICAL SUPPLY — 59 items
BIPOLAR CUTTING LOOP 21FR (ELECTRODE)
CABLE HIGH FREQUENCY MONO STRZ (ELECTRODE) ×4 IMPLANT
CANISTER SUCT 3000ML PPV (MISCELLANEOUS) ×8 IMPLANT
CATH ROBINSON RED A/P 16FR (CATHETERS) ×4 IMPLANT
COVER WAND RF STERILE (DRAPES) ×4 IMPLANT
DERMABOND ADVANCED (GAUZE/BANDAGES/DRESSINGS) ×1
DERMABOND ADVANCED .7 DNX12 (GAUZE/BANDAGES/DRESSINGS) ×3 IMPLANT
DEVICE MYOSURE LITE (MISCELLANEOUS) IMPLANT
DEVICE MYOSURE REACH (MISCELLANEOUS) IMPLANT
DILATOR CANAL MILEX (MISCELLANEOUS) IMPLANT
DRSG COVADERM PLUS 2X2 (GAUZE/BANDAGES/DRESSINGS) IMPLANT
DRSG OPSITE POSTOP 3X4 (GAUZE/BANDAGES/DRESSINGS) IMPLANT
DURAPREP 26ML APPLICATOR (WOUND CARE) ×4 IMPLANT
ELECT REM PT RETURN 9FT ADLT (ELECTROSURGICAL) ×4
ELECTRODE REM PT RTRN 9FT ADLT (ELECTROSURGICAL) ×3 IMPLANT
GAUZE 4X4 16PLY RFD (DISPOSABLE) ×4 IMPLANT
GLOVE BIO SURGEON STRL SZ 6 (GLOVE) ×4 IMPLANT
GLOVE BIO SURGEON STRL SZ 6.5 (GLOVE) ×4 IMPLANT
GLOVE BIOGEL PI IND STRL 6.5 (GLOVE) ×9 IMPLANT
GLOVE BIOGEL PI IND STRL 7.0 (GLOVE) ×6 IMPLANT
GLOVE BIOGEL PI IND STRL 8 (GLOVE) ×3 IMPLANT
GLOVE BIOGEL PI INDICATOR 6.5 (GLOVE) ×3
GLOVE BIOGEL PI INDICATOR 7.0 (GLOVE) ×2
GLOVE BIOGEL PI INDICATOR 8 (GLOVE) ×1
GLOVE ORTHO TXT STRL SZ7.5 (GLOVE) ×4 IMPLANT
GOWN STRL REUS W/ TWL XL LVL3 (GOWN DISPOSABLE) ×3 IMPLANT
GOWN STRL REUS W/TWL LRG LVL3 (GOWN DISPOSABLE) ×12 IMPLANT
GOWN STRL REUS W/TWL XL LVL3 (GOWN DISPOSABLE) ×5 IMPLANT
IV NS IRRIG 3000ML ARTHROMATIC (IV SOLUTION) ×8 IMPLANT
KIT PROCEDURE FLUENT (KITS) IMPLANT
KIT TURNOVER CYSTO (KITS) ×4 IMPLANT
LOOP CUTTING BIPOLAR 21FR (ELECTRODE) IMPLANT
MYOSURE XL FIBROID (MISCELLANEOUS)
NEEDLE INSUFFLATION 120MM (ENDOMECHANICALS) ×4 IMPLANT
NS IRRIG 1000ML POUR BTL (IV SOLUTION) IMPLANT
PACK LAPAROSCOPY BASIN (CUSTOM PROCEDURE TRAY) ×4 IMPLANT
PACK TRENDGUARD 450 HYBRID PRO (MISCELLANEOUS) ×3 IMPLANT
PACK VAGINAL MINOR WOMEN LF (CUSTOM PROCEDURE TRAY) ×4 IMPLANT
PAD OB MATERNITY 4.3X12.25 (PERSONAL CARE ITEMS) ×4 IMPLANT
PAD PREP 24X48 CUFFED NSTRL (MISCELLANEOUS) ×4 IMPLANT
PROTECTOR NERVE ULNAR (MISCELLANEOUS) ×8 IMPLANT
SEAL CERVICAL OMNI LOK (ABLATOR) IMPLANT
SEAL ROD LENS SCOPE MYOSURE (ABLATOR) ×4 IMPLANT
SET IRRIG TUBING LAPAROSCOPIC (IRRIGATION / IRRIGATOR) ×4 IMPLANT
SET TUBE SMOKE EVAC HIGH FLOW (TUBING) ×4 IMPLANT
SHEARS HARMONIC ACE PLUS 36CM (ENDOMECHANICALS) ×4 IMPLANT
SUT VICRYL 0 UR6 27IN ABS (SUTURE) ×4 IMPLANT
SUT VICRYL 4-0 PS2 18IN ABS (SUTURE) ×4 IMPLANT
SYS BAG RETRIEVAL 10MM (BASKET) ×4
SYSTEM BAG RETRIEVAL 10MM (BASKET) ×3 IMPLANT
SYSTEM TISS REMOVAL MYOSURE XL (MISCELLANEOUS) IMPLANT
TOWEL OR 17X26 10 PK STRL BLUE (TOWEL DISPOSABLE) ×8 IMPLANT
TRENDGUARD 450 HYBRID PRO PACK (MISCELLANEOUS) ×4
TROCAR BALLN 12MMX100 BLUNT (TROCAR) IMPLANT
TROCAR BLADELESS OPT 5 100 (ENDOMECHANICALS) ×8 IMPLANT
TROCAR XCEL BLUNT TIP 100MML (ENDOMECHANICALS) IMPLANT
TROCAR XCEL NON-BLD 11X100MML (ENDOMECHANICALS) IMPLANT
WARMER LAPAROSCOPE (MISCELLANEOUS) ×4 IMPLANT
WATER STERILE IRR 500ML POUR (IV SOLUTION) IMPLANT

## 2019-08-26 NOTE — Anesthesia Postprocedure Evaluation (Signed)
Anesthesia Post Note  Patient: Stephanie [name]  Procedure(s) Performed: LAPAROSCOPIC BILATERAL SALPINGO OOPHORECTOMY (Right Abdomen) HYSTEROSCOPY (N/A Uterus)     Patient location during evaluation: PACU Anesthesia Type: General Level of consciousness: awake and alert, oriented and patient cooperative Pain management: pain level controlled Vital Signs Assessment: post-procedure vital signs reviewed and stable Respiratory status: spontaneous breathing, nonlabored ventilation and respiratory function stable Cardiovascular status: blood pressure returned to baseline and stable Postop Assessment: no apparent nausea or vomiting Anesthetic complications: no    Last Vitals:  Vitals:   08/26/19 0544 08/26/19 0934  BP: 117/79 140/83  Pulse: 78 91  Resp: 14 10  Temp: 36.6 C (!) 36.4 C  SpO2: 100% 100%    Last Pain:  Vitals:   08/26/19 0934  TempSrc:   PainSc: 0-No pain                 Pervis Hocking

## 2019-08-26 NOTE — Interval H&P Note (Signed)
History and Physical Interval Note:  08/26/2019 7:16 AM  Stephanie [name]  has presented today for surgery, with the diagnosis of right ovarian cyst, poss polyp.  The various methods of treatment have been discussed with the patient and family. After consideration of risks, benefits and other options for treatment, the patient has consented to  Procedure(s) with comments: LAPAROSCOPIC UNILATERAL SALPINGO OOPHORECTOMY (Right) DILATATION & CURETTAGE/HYSTEROSCOPY WITH MYOSURE, poss resection of polyp (N/A) - poss resection of polyp as a surgical intervention.  Per discussion with patient this morning, will take out both tubes and ovaries.  The patient's history has been reviewed, patient examined, no change in status, stable for surgery.  I have reviewed the patient's chart and labs.  Questions were answered to the patient's satisfaction.     Blane Ohara Stephanie [name]

## 2019-08-26 NOTE — Anesthesia Preprocedure Evaluation (Addendum)
Anesthesia Evaluation  Patient identified by MRN, date of birth, ID band Patient awake    Reviewed: Allergy & Precautions, NPO status , Patient's Chart, lab work & pertinent test results  Airway Mallampati: II  TM Distance: >3 FB Neck ROM: Full    Dental  (+) Poor Dentition, Partial Upper, Partial Lower, Dental Advisory Given   Pulmonary COPD, former smoker,  Quit smoking 2012, 50 pack year history   Pulmonary exam normal breath sounds clear to auscultation       Cardiovascular hypertension, Pt. on medications negative cardio ROS Normal cardiovascular exam Rhythm:Regular Rate:Normal     Neuro/Psych negative neurological ROS  negative psych ROS   GI/Hepatic negative GI ROS, Neg liver ROS,   Endo/Other  negative endocrine ROS  Renal/GU negative Renal ROS   Right ovarian cyst    Musculoskeletal negative musculoskeletal ROS (+)   Abdominal Normal abdominal exam  (+)   Peds negative pediatric ROS (+)  Hematology negative hematology ROS (+)   Anesthesia Other Findings   Reproductive/Obstetrics negative OB ROS                           Anesthesia Physical Anesthesia Plan  ASA: II  Anesthesia Plan: General   Post-op Pain Management:    Induction: Intravenous  PONV Risk Score and Plan: 3 and Ondansetron, Dexamethasone, Midazolam and Treatment may vary due to age or medical condition  Airway Management Planned: Oral ETT  Additional Equipment: None  Intra-op Plan:   Post-operative Plan: Extubation in OR  Informed Consent: I have reviewed the patients History and Physical, chart, labs and discussed the procedure including the risks, benefits and alternatives for the proposed anesthesia with the patient or authorized representative who has indicated his/her understanding and acceptance.     Dental advisory given  Plan Discussed with: CRNA  Anesthesia Plan Comments:          Anesthesia Quick Evaluation

## 2019-08-26 NOTE — Discharge Instructions (Signed)
DISCHARGE INSTRUCTIONS: Laparoscopy  The following instructions have been prepared to help you care for yourself upon your return home today.  Wound care:  Do not get the incision wet for the first 24 hours. The incision should be kept clean and dry.  The Band-Aids or dressings may be removed the day after surgery.  Should the incision become sore, red, and swollen after the first week, check with your doctor.  Personal hygiene:  Shower the day after your procedure.  Activity and limitations:  Do NOT drive or operate any equipment today.  Do NOT lift anything more than 15 pounds for 2-3 weeks after surgery.  Do NOT rest in bed all day.  Walking is encouraged. Walk each day, starting slowly with 5-minute walks 3 or 4 times a day. Slowly increase the length of your walks.  Walk up and down stairs slowly.  Do NOT do strenuous activities, such as golfing, playing tennis, bowling, running, biking, weight lifting, gardening, mowing, or vacuuming for 2-4 weeks. Ask your doctor when it is okay to start.  Diet: Eat a light meal as desired this evening. You may resume your usual diet tomorrow.  Return to work: This is dependent on the type of work you do. For the most part you can return to a desk job within a week of surgery. If you are more active at work, please discuss this with your doctor.  What to expect after your surgery: You may have a slight burning sensation when you urinate on the first day. You may have a very small amount of blood in the urine. Expect to have a small amount of vaginal discharge/light bleeding for 1-2 weeks. It is not unusual to have abdominal soreness and bruising for up to 2 weeks. You may be tired and need more rest for about 1 week. You may experience shoulder pain for 24-72 hours. Lying flat in bed may relieve it.  Call your doctor for any of the following:  Develop a fever of 100.4 or greater  Inability to urinate 6 hours after discharge from  hospital  Severe pain not relieved by pain medications  Persistent of heavy bleeding at incision site  Redness or swelling around incision site after a week  Increasing nausea or vomiting  Patient Signature________________________________________ Nurse Signature_________________________________________    No advil, aleve, motrin, ibuprofen until 315 pm today   Post Anesthesia Home Care Instructions  Activity: Get plenty of rest for the remainder of the day. A responsible adult should stay with you for 24 hours following the procedure.  For the next 24 hours, DO NOT: -Drive a car -Paediatric nurse -Drink alcoholic beverages -Take any medication unless instructed by your physician -Make any legal decisions or sign important papers.  Meals: Start with liquid foods such as gelatin or soup. Progress to regular foods as tolerated. Avoid greasy, spicy, heavy foods. If nausea and/or vomiting occur, drink only clear liquids until the nausea and/or vomiting subsides. Call your physician if vomiting continues.  Special Instructions/Symptoms: Your throat may feel dry or sore from the anesthesia or the breathing tube placed in your throat during surgery. If this causes discomfort, gargle with warm salt water. The discomfort should disappear within 24 hours.  If you had a scopolamine patch placed behind your ear for the management of post- operative nausea and/or vomiting:  1. The medication in the patch is effective for 72 hours, after which it should be removed.  Wrap patch in a tissue and discard in the  trash. Wash hands thoroughly with soap and water. 2. You may remove the patch earlier than 72 hours if you experience unpleasant side effects which may include dry mouth, dizziness or visual disturbances. 3. Avoid touching the patch. Wash your hands with soap and water after contact with the patch.   Routine instructions for laparoscopy and hysteroscopy

## 2019-08-26 NOTE — Anesthesia Procedure Notes (Signed)
Procedure Name: Intubation Date/Time: 08/26/2019 7:38 AM Performed by: Mechele Claude, CRNA Pre-anesthesia Checklist: Patient identified, Emergency Drugs available, Suction available and Patient being monitored Patient Re-evaluated:Patient Re-evaluated prior to induction Oxygen Delivery Method: Circle system utilized Preoxygenation: Pre-oxygenation with 100% oxygen Induction Type: IV induction Ventilation: Mask ventilation without difficulty Grade View: Grade I Tube type: Oral Tube size: 7.0 mm Number of attempts: 1 Airway Equipment and Method: Stylet and Oral airway Placement Confirmation: ETT inserted through vocal cords under direct vision,  positive ETCO2 and breath sounds checked- equal and bilateral Secured at: 19 cm Tube secured with: Tape Dental Injury: Teeth and Oropharynx as per pre-operative assessment

## 2019-08-26 NOTE — Transfer of Care (Signed)
  Last Vitals:  Vitals Value Taken Time  BP 140/83 08/26/19 0934  Temp 36.4 C 08/26/19 0934  Pulse 90 08/26/19 0938  Resp 12 08/26/19 0938  SpO2 100 % 08/26/19 0938  Vitals shown include unvalidated device data.  Last Pain:  Vitals:   08/26/19 0544  TempSrc: Oral         Immediate Anesthesia Transfer of Care Note  Patient: Stephanie [name]  Procedure(s) Performed: Procedure(s) (LRB): LAPAROSCOPIC BILATERAL SALPINGO OOPHORECTOMY (Right) HYSTEROSCOPY (N/A)  Patient Location: PACU  Anesthesia Type: General  Level of Consciousness: awake, alert  and oriented  Airway & Oxygen Therapy: Patient Spontanous Breathing and Patient connected to nasal cannula  oxygen  Post-op Assessment: Report given to PACU RN and Post -op Vital signs reviewed and stable  Post vital signs: Reviewed and stable  Complications: No apparent anesthesia complications

## 2019-08-26 NOTE — Op Note (Signed)
Obstetrics Op Note 03/02/2015 8:27 AM    Expand All Collapse All   Preoperative diagnosis: Right ovarian mass, possible endometrial polyp Postoperative diagnosis: Right ovarian mass Procedure: Open laparoscopy with bilateral salpingo-oophorectomy, hysteroscopy Surgeon: Cheri Fowler M.D. Assistant:  Eula Flax, MD Anesthesia: Gen. Endotracheal tube Findings: She had a normal uterus, left tube and ovary. Right ovary was enlarged and cystic.  It ruptured during the case with dark, thick fluid. The remainder of her abdomen appeared normal with some omental adhesions to the left anterior abdominal wall. Estimated blood loss: 25cc Specimens: Bilateral tubes and ovaries Complications: None  Procedure in detail  The patient was taken to the operating room and placed in the dorsal supine position. General anesthesia was induced.  Both arms were tucked to her side and legs were placed in the mobile stirrups. Abdomen perineum and vagina were then prepped and draped in the usual sterile fashion, bladder drained with a red Robinson catheter, acorn tenaculum applied to the cervix for uterine manipulation. Infraumbilical skin was then infiltrated with quarter percent Marcaine and a 3 cm horizontal incision was made. Fascia was identified and elevated and entered sharply. Peritoneum was then also elevated and entered sharply. The incision was extended bilaterally. A pursestring suture of 0 Vicryl was placed around the fascia and the Hassan cannula was inserted and secured. Abdomen was insufflated with CO2 and good visualization was achieved. 5 mm ports were placed on each side under direct visualization. Inspection revealed the above-mentioned findings.  The right fallopian tube was grasped from the left side and elevated.  The harmonic scalpel Ace was then used from the right side to help take down the right infundibulopelvic ligament and all attachments of the right tube and ovary to the pelvic sidewall and  the uterus.  This was done with some difficulty.  During this dissection the cyst ruptured with thick dark fluid which was irrigated and removed.  Eventually were able to remove the entire tube and ovary and placed in the posterior cul-de-sac for later removal.  Left tube and ovary were then easily identified.  They were grasped from the right side.  The harmonic scalpel Ace was then used to take down the left infundibulopelvic ligament, mesosalpinx and the attachments of the tube and ovary to the uterus.  The 5 mm scope was then placed through the port on the right and an Endo Catch bag was placed through the umbilical port.  Both tubes and ovaries were scooped into the bag and brought to the umbilical incision.  The pelvis was then irrigated copiously and found to be hemostatic.  The right ureter had been skeletonized while taking out the right ovary and it was intact and had good peristalsis.  The Endo Catch bag with the tubes and ovaries was easily removed through the umbilical incision.  The previously placed pursestring suture was then tied with good fascial closure.. 5 mm ports were removed. All skin incisions were closed with interrupted subcuticular sutures of 4-0 Vicryl followed by Dermabond.  Attention was then turned vaginally.    The previously placed acorn tenaculum and single-tooth tenaculum were removed.  A Graves speculum was inserted in the vagina and the anterior lip of the cervix was grasped with a single-tooth tenaculum. Paracervical block was performed with a total of 16 cc of 2% plain lidocaine. The uterus then sounded to 8 cm. The cervix was gradually dilated to a size 19 dilator without difficulty. The Myosure hysteroscope was inserted and good visualization was achieved.  No significant endometrial lesions were identified, she had a very tiny normal endometrial cavity. The hysteroscope was removed.   The single-tooth tenaculum was removed from the cervix and bleeding was controlled with  pressure. All instruments were then removed from the vagina. The patient tolerated the procedure well and was taken to the recovery in stable condition. Counts were correct and she had PAS hose on throughout the procedure.

## 2019-08-29 ENCOUNTER — Encounter (HOSPITAL_BASED_OUTPATIENT_CLINIC_OR_DEPARTMENT_OTHER): Payer: Self-pay | Admitting: Obstetrics and Gynecology

## 2019-08-29 LAB — CYTOLOGY - NON PAP

## 2019-08-30 LAB — SURGICAL PATHOLOGY

## 2019-09-23 DIAGNOSIS — U071 COVID-19: Secondary | ICD-10-CM

## 2019-09-23 HISTORY — DX: COVID-19: U07.1

## 2019-12-21 NOTE — H&P (Signed)
Surgical History & Physical  Patient Name: Stephanie [name] DOB: 1956/02/23  Surgery: Cataract extraction with intraocular lens implant phacoemulsification; Left Eye  Surgeon: Baruch Goldmann MD Surgery Date:  01/02/2020 Pre-Op Date:  12/21/2019  HPI: A 33 Yr. old female patient is referred by Dr Rosana Hoes for cataract eval OU. 1. 1. The patient complains of difficulty when viewing TV, reading closed caption, news scrolls on TV, which began 2 years ago. Both eyes are affected, but the left eye is much worse than the right.. The episode is gradual. The condition's severity increased since last visit. Symptoms occur when the patient is driving and outside. The complaint is associated with glare. Pt states she is not comfortable driving at night due to glare. This is negatively affecting the patient's quality of life. HPI Completed by Dr. Baruch Goldmann  Medical History: Cataracts Floaters, Retinal drusen OU Keratoacanthoma High Blood Pressure  Review of Systems Negative Allergic/Immunologic Negative Cardiovascular Negative Constitutional Negative Ear, Nose, Mouth & Throat Negative Endocrine Negative Eyes Negative Gastrointestinal Negative Genitourinary Negative Hemotologic/Lymphatic Negative Integumentary Negative Musculoskeletal Negative Neurological Negative Psychiatry Negative Respiratory  Social   Former smoker  Medication Triamterene,   Sx/Procedures Laparascopic salphingoopherectomy,   Drug Allergies   NKDA  History & Physical: Heent:  Cataract, Left eye NECK: supple without bruits LUNGS: lungs clear to auscultation CV: regular rate and rhythm Abdomen: soft and non-tender  Impression & Plan: Assessment: 1.  COMBINED FORMS AGE RELATED CATARACT; Both Eyes (H25.813) 2.  NUCLEAR SCLEROSIS AGE RELATED; Both Eyes (H25.13)  Plan: 1.  Cataract accounts for the patient's decreased vision. This visual impairment is not correctable with a tolerable change in glasses or  contact lenses. Cataract surgery with an implantation of a new lens should significantly improve the visual and functional status of the patient. Discussed all risks, benefits, alternatives, and potential complications. Discussed the procedures and recovery. Patient desires to have surgery. A-scan ordered and performed today for intra-ocular lens calculations. The surgery will be performed in order to improve vision for driving, reading, and for eye examinations. Recommend phacoemulsification with intra-ocular lens. Left Eye worse. Dilates well - shugarcaine by protocol.  Right eye is 20/20 - patient will monitor with Dr. Rosana Hoes and can return any time for surgery OD.

## 2019-12-30 ENCOUNTER — Other Ambulatory Visit (HOSPITAL_COMMUNITY): Payer: 59

## 2019-12-30 ENCOUNTER — Encounter (HOSPITAL_COMMUNITY): Admission: RE | Admit: 2019-12-30 | Payer: No Typology Code available for payment source | Source: Ambulatory Visit

## 2020-01-02 ENCOUNTER — Encounter (HOSPITAL_COMMUNITY): Admission: RE | Payer: Self-pay | Source: Home / Self Care

## 2020-01-02 ENCOUNTER — Ambulatory Visit (HOSPITAL_COMMUNITY)
Admission: RE | Admit: 2020-01-02 | Payer: No Typology Code available for payment source | Source: Home / Self Care | Admitting: Ophthalmology

## 2020-01-02 SURGERY — PHACOEMULSIFICATION, CATARACT, WITH IOL INSERTION
Anesthesia: Monitor Anesthesia Care | Laterality: Left

## 2020-04-13 ENCOUNTER — Other Ambulatory Visit: Payer: Self-pay | Admitting: Obstetrics and Gynecology

## 2020-04-13 DIAGNOSIS — N6452 Nipple discharge: Secondary | ICD-10-CM

## 2020-05-10 ENCOUNTER — Other Ambulatory Visit: Payer: No Typology Code available for payment source

## 2020-05-14 ENCOUNTER — Other Ambulatory Visit: Payer: Self-pay | Admitting: Obstetrics and Gynecology

## 2020-05-14 ENCOUNTER — Ambulatory Visit
Admission: RE | Admit: 2020-05-14 | Discharge: 2020-05-14 | Disposition: A | Discharge status: Home / Self Care | Payer: No Typology Code available for payment source | Source: Ambulatory Visit | Attending: Obstetrics and Gynecology | Admitting: Obstetrics and Gynecology

## 2020-05-14 ENCOUNTER — Other Ambulatory Visit: Payer: Self-pay

## 2020-05-14 DIAGNOSIS — N6452 Nipple discharge: Secondary | ICD-10-CM

## 2020-05-31 ENCOUNTER — Ambulatory Visit
Admission: RE | Admit: 2020-05-31 | Discharge: 2020-05-31 | Disposition: A | Discharge status: Home / Self Care | Payer: No Typology Code available for payment source | Source: Ambulatory Visit | Attending: Obstetrics and Gynecology | Admitting: Obstetrics and Gynecology

## 2020-05-31 ENCOUNTER — Other Ambulatory Visit: Payer: Self-pay

## 2020-05-31 ENCOUNTER — Other Ambulatory Visit: Payer: No Typology Code available for payment source

## 2020-05-31 DIAGNOSIS — N6452 Nipple discharge: Secondary | ICD-10-CM

## 2020-07-12 ENCOUNTER — Other Ambulatory Visit: Payer: Self-pay | Admitting: General Surgery

## 2020-07-12 DIAGNOSIS — N6452 Nipple discharge: Secondary | ICD-10-CM

## 2020-07-12 DIAGNOSIS — N6012 Diffuse cystic mastopathy of left breast: Secondary | ICD-10-CM

## 2020-08-24 ENCOUNTER — Other Ambulatory Visit: Payer: Self-pay | Admitting: General Surgery

## 2020-08-24 DIAGNOSIS — N6452 Nipple discharge: Secondary | ICD-10-CM

## 2020-10-04 ENCOUNTER — Other Ambulatory Visit: Payer: Self-pay

## 2020-10-04 ENCOUNTER — Encounter (HOSPITAL_BASED_OUTPATIENT_CLINIC_OR_DEPARTMENT_OTHER): Payer: Self-pay | Admitting: General Surgery

## 2020-10-05 ENCOUNTER — Other Ambulatory Visit: Payer: Self-pay

## 2020-10-08 ENCOUNTER — Inpatient Hospital Stay (HOSPITAL_COMMUNITY): Admission: RE | Admit: 2020-10-08 | Payer: No Typology Code available for payment source | Source: Ambulatory Visit

## 2020-10-09 ENCOUNTER — Other Ambulatory Visit (HOSPITAL_COMMUNITY)
Admission: RE | Admit: 2020-10-09 | Discharge: 2020-10-09 | Disposition: A | Discharge status: Home / Self Care | Payer: No Typology Code available for payment source | Source: Ambulatory Visit | Attending: General Surgery | Admitting: General Surgery

## 2020-10-09 ENCOUNTER — Encounter (HOSPITAL_BASED_OUTPATIENT_CLINIC_OR_DEPARTMENT_OTHER)
Admission: RE | Admit: 2020-10-09 | Discharge: 2020-10-09 | Disposition: A | Discharge status: Home / Self Care | Payer: No Typology Code available for payment source | Source: Ambulatory Visit | Attending: General Surgery | Admitting: General Surgery

## 2020-10-09 DIAGNOSIS — Z803 Family history of malignant neoplasm of breast: Secondary | ICD-10-CM | POA: Diagnosis not present

## 2020-10-09 DIAGNOSIS — Z01818 Encounter for other preprocedural examination: Secondary | ICD-10-CM | POA: Diagnosis present

## 2020-10-09 DIAGNOSIS — D242 Benign neoplasm of left breast: Secondary | ICD-10-CM | POA: Diagnosis not present

## 2020-10-09 DIAGNOSIS — Z79899 Other long term (current) drug therapy: Secondary | ICD-10-CM | POA: Diagnosis not present

## 2020-10-09 DIAGNOSIS — N6452 Nipple discharge: Secondary | ICD-10-CM | POA: Diagnosis not present

## 2020-10-09 DIAGNOSIS — N6489 Other specified disorders of breast: Secondary | ICD-10-CM | POA: Diagnosis present

## 2020-10-09 DIAGNOSIS — Z01812 Encounter for preprocedural laboratory examination: Secondary | ICD-10-CM | POA: Insufficient documentation

## 2020-10-09 DIAGNOSIS — N6082 Other benign mammary dysplasias of left breast: Secondary | ICD-10-CM | POA: Diagnosis not present

## 2020-10-09 DIAGNOSIS — Z20822 Contact with and (suspected) exposure to covid-19: Secondary | ICD-10-CM | POA: Insufficient documentation

## 2020-10-09 DIAGNOSIS — Z87891 Personal history of nicotine dependence: Secondary | ICD-10-CM | POA: Diagnosis not present

## 2020-10-09 LAB — BASIC METABOLIC PANEL
Anion gap: 12 (ref 5–15)
BUN: 13 mg/dL (ref 8–23)
CO2: 27 mmol/L (ref 22–32)
Calcium: 9.4 mg/dL (ref 8.9–10.3)
Chloride: 97 mmol/L — ABNORMAL LOW (ref 98–111)
Creatinine, Ser: 0.77 mg/dL (ref 0.44–1.00)
GFR, Estimated: >60 mL/min (ref >60)
Glucose, Bld: 102 mg/dL — ABNORMAL HIGH (ref 70–99)
Potassium: 3.3 mmol/L — ABNORMAL LOW (ref 3.5–5.1)
Sodium: 136 mmol/L (ref 135–145)

## 2020-10-09 MED ORDER — ENSURE PRE-SURGERY PO LIQD: 296.0000 mL | Freq: Once | ORAL | Status: DC

## 2020-10-09 NOTE — Progress Notes (Signed)

## 2020-10-10 ENCOUNTER — Other Ambulatory Visit: Payer: Self-pay

## 2020-10-10 ENCOUNTER — Ambulatory Visit
Admission: RE | Admit: 2020-10-10 | Discharge: 2020-10-10 | Disposition: A | Discharge status: Home / Self Care | Payer: No Typology Code available for payment source | Source: Ambulatory Visit | Attending: General Surgery | Admitting: General Surgery

## 2020-10-10 DIAGNOSIS — N6452 Nipple discharge: Secondary | ICD-10-CM

## 2020-10-10 LAB — SARS CORONAVIRUS 2 (TAT 6-24 HRS): SARS Coronavirus 2: NEGATIVE

## 2020-10-11 ENCOUNTER — Ambulatory Visit (HOSPITAL_BASED_OUTPATIENT_CLINIC_OR_DEPARTMENT_OTHER): Payer: No Typology Code available for payment source | Admitting: Anesthesiology

## 2020-10-11 ENCOUNTER — Other Ambulatory Visit: Payer: Self-pay

## 2020-10-11 ENCOUNTER — Ambulatory Visit (HOSPITAL_BASED_OUTPATIENT_CLINIC_OR_DEPARTMENT_OTHER)
Admission: RE | Admit: 2020-10-11 | Discharge: 2020-10-11 | Disposition: A | Discharge status: Home / Self Care | Payer: No Typology Code available for payment source | Attending: General Surgery | Admitting: General Surgery

## 2020-10-11 ENCOUNTER — Encounter (HOSPITAL_BASED_OUTPATIENT_CLINIC_OR_DEPARTMENT_OTHER): Payer: Self-pay | Admitting: General Surgery

## 2020-10-11 ENCOUNTER — Ambulatory Visit
Admission: RE | Admit: 2020-10-11 | Discharge: 2020-10-11 | Disposition: A | Discharge status: Home / Self Care | Payer: No Typology Code available for payment source | Source: Ambulatory Visit | Attending: General Surgery | Admitting: General Surgery

## 2020-10-11 ENCOUNTER — Encounter (HOSPITAL_BASED_OUTPATIENT_CLINIC_OR_DEPARTMENT_OTHER)
Admission: RE | Disposition: A | Discharge status: Home / Self Care | Payer: Self-pay | Source: Home / Self Care | Attending: General Surgery

## 2020-10-11 DIAGNOSIS — N6452 Nipple discharge: Secondary | ICD-10-CM

## 2020-10-11 DIAGNOSIS — N6082 Other benign mammary dysplasias of left breast: Secondary | ICD-10-CM | POA: Insufficient documentation

## 2020-10-11 DIAGNOSIS — Z803 Family history of malignant neoplasm of breast: Secondary | ICD-10-CM | POA: Insufficient documentation

## 2020-10-11 DIAGNOSIS — D242 Benign neoplasm of left breast: Secondary | ICD-10-CM | POA: Diagnosis not present

## 2020-10-11 DIAGNOSIS — Z79899 Other long term (current) drug therapy: Secondary | ICD-10-CM | POA: Insufficient documentation

## 2020-10-11 DIAGNOSIS — Z87891 Personal history of nicotine dependence: Secondary | ICD-10-CM | POA: Insufficient documentation

## 2020-10-11 HISTORY — PX: RADIOACTIVE SEED GUIDED EXCISIONAL BREAST BIOPSY: SHX6490

## 2020-10-11 SURGERY — RADIOACTIVE SEED GUIDED BREAST BIOPSY
Anesthesia: General | Site: Breast | Laterality: Left

## 2020-10-11 MED ORDER — BUPIVACAINE HCL (PF) 0.25 % IJ SOLN
INTRAMUSCULAR | Status: DC | PRN
  Administered 2020-10-11: 10 mL

## 2020-10-11 MED ORDER — AMISULPRIDE (ANTIEMETIC) 5 MG/2ML IV SOLN: 10.0000 mg | Freq: Once | INTRAVENOUS | Status: DC | PRN

## 2020-10-11 MED ORDER — CEFAZOLIN SODIUM-DEXTROSE 2-4 GM/100ML-% IV SOLN
INTRAVENOUS | Status: AC
  Filled 2020-10-11: qty 100

## 2020-10-11 MED ORDER — PROPOFOL 10 MG/ML IV BOLUS
INTRAVENOUS | Status: AC
  Filled 2020-10-11: qty 40

## 2020-10-11 MED ORDER — KETOROLAC TROMETHAMINE 15 MG/ML IJ SOLN
15.0000 mg | INTRAMUSCULAR | Status: AC
  Administered 2020-10-11: 15 mg via INTRAVENOUS

## 2020-10-11 MED ORDER — ACETAMINOPHEN 500 MG PO TABS
1000.0000 mg | ORAL_TABLET | ORAL | Status: AC
  Administered 2020-10-11: 1000 mg via ORAL

## 2020-10-11 MED ORDER — 0.9 % SODIUM CHLORIDE (POUR BTL) OPTIME
TOPICAL | Status: DC | PRN
  Administered 2020-10-11: 50 mL

## 2020-10-11 MED ORDER — CEFAZOLIN SODIUM-DEXTROSE 2-4 GM/100ML-% IV SOLN: 2.0000 g | INTRAVENOUS | Status: DC

## 2020-10-11 MED ORDER — FENTANYL CITRATE (PF) 100 MCG/2ML IJ SOLN
INTRAMUSCULAR | Status: DC | PRN
  Administered 2020-10-11: 50 ug via INTRAVENOUS

## 2020-10-11 MED ORDER — PHENYLEPHRINE HCL (PRESSORS) 10 MG/ML IV SOLN
INTRAVENOUS | Status: DC | PRN
  Administered 2020-10-11: 200 ug via INTRAVENOUS
  Administered 2020-10-11: 120 ug via INTRAVENOUS

## 2020-10-11 MED ORDER — LIDOCAINE HCL (CARDIAC) PF 100 MG/5ML IV SOSY
PREFILLED_SYRINGE | INTRAVENOUS | Status: DC | PRN
  Administered 2020-10-11: 50 mg via INTRAVENOUS

## 2020-10-11 MED ORDER — ACETAMINOPHEN 500 MG PO TABS
ORAL_TABLET | ORAL | Status: AC
  Filled 2020-10-11: qty 2

## 2020-10-11 MED ORDER — DEXAMETHASONE SODIUM PHOSPHATE 10 MG/ML IJ SOLN
INTRAMUSCULAR | Status: AC
  Filled 2020-10-11: qty 1

## 2020-10-11 MED ORDER — CEFAZOLIN SODIUM-DEXTROSE 2-3 GM-%(50ML) IV SOLR
INTRAVENOUS | Status: DC | PRN
  Administered 2020-10-11: 2 g via INTRAVENOUS

## 2020-10-11 MED ORDER — PROPOFOL 10 MG/ML IV BOLUS
INTRAVENOUS | Status: DC | PRN
Start: 2020-10-11 — End: 2020-10-11
  Administered 2020-10-11: 120 mg via INTRAVENOUS

## 2020-10-11 MED ORDER — FENTANYL CITRATE (PF) 100 MCG/2ML IJ SOLN: 25.0000 ug | INTRAMUSCULAR | Status: DC | PRN

## 2020-10-11 MED ORDER — ONDANSETRON HCL 4 MG/2ML IJ SOLN
INTRAMUSCULAR | Status: AC
  Filled 2020-10-11: qty 2

## 2020-10-11 MED ORDER — LACTATED RINGERS IV SOLN: INTRAVENOUS | Status: DC

## 2020-10-11 MED ORDER — KETOROLAC TROMETHAMINE 15 MG/ML IJ SOLN
INTRAMUSCULAR | Status: AC
  Filled 2020-10-11: qty 1

## 2020-10-11 MED ORDER — ONDANSETRON HCL 4 MG/2ML IJ SOLN
INTRAMUSCULAR | Status: DC | PRN
  Administered 2020-10-11: 4 mg via INTRAVENOUS

## 2020-10-11 MED ORDER — LACTATED RINGERS IV SOLN: INTRAVENOUS | Status: DC | PRN

## 2020-10-11 MED ORDER — FENTANYL CITRATE (PF) 100 MCG/2ML IJ SOLN
INTRAMUSCULAR | Status: AC
  Filled 2020-10-11: qty 2

## 2020-10-11 MED ORDER — DEXAMETHASONE SODIUM PHOSPHATE 10 MG/ML IJ SOLN
INTRAMUSCULAR | Status: DC | PRN
  Administered 2020-10-11: 5 mg via INTRAVENOUS

## 2020-10-11 MED ORDER — LIDOCAINE 2% (20 MG/ML) 5 ML SYRINGE
INTRAMUSCULAR | Status: AC
  Filled 2020-10-11: qty 5

## 2020-10-11 SURGICAL SUPPLY — 53 items
APPLIER CLIP 9.375 MED OPEN (MISCELLANEOUS)
BINDER BREAST LRG (GAUZE/BANDAGES/DRESSINGS) IMPLANT
BINDER BREAST MEDIUM (GAUZE/BANDAGES/DRESSINGS) ×2 IMPLANT
BINDER BREAST XLRG (GAUZE/BANDAGES/DRESSINGS) IMPLANT
BINDER BREAST XXLRG (GAUZE/BANDAGES/DRESSINGS) IMPLANT
BLADE SURG 15 STRL LF DISP TIS (BLADE) ×1 IMPLANT
BLADE SURG 15 STRL SS (BLADE) ×2
CANISTER SUC SOCK COL 7IN (MISCELLANEOUS) IMPLANT
CANISTER SUCT 1200ML W/VALVE (MISCELLANEOUS) IMPLANT
CHLORAPREP W/TINT 26 (MISCELLANEOUS) ×2 IMPLANT
CLIP APPLIE 9.375 MED OPEN (MISCELLANEOUS) IMPLANT
CLIP VESOCCLUDE SM WIDE 6/CT (CLIP) IMPLANT
COVER BACK TABLE 60X90IN (DRAPES) ×2 IMPLANT
COVER MAYO STAND STRL (DRAPES) ×2 IMPLANT
COVER PROBE W GEL 5X96 (DRAPES) IMPLANT
COVER WAND RF STERILE (DRAPES) IMPLANT
DECANTER SPIKE VIAL GLASS SM (MISCELLANEOUS) IMPLANT
DERMABOND ADVANCED (GAUZE/BANDAGES/DRESSINGS) ×1
DERMABOND ADVANCED .7 DNX12 (GAUZE/BANDAGES/DRESSINGS) ×1 IMPLANT
DRAPE GAMMA PROBE CRDLSS 10X38 (DRAPES) ×2 IMPLANT
DRAPE LAPAROSCOPIC ABDOMINAL (DRAPES) ×2 IMPLANT
DRAPE UTILITY XL STRL (DRAPES) ×2 IMPLANT
DRSG TEGADERM 4X4.75 (GAUZE/BANDAGES/DRESSINGS) IMPLANT
ELECT COATED BLADE 2.86 ST (ELECTRODE) ×2 IMPLANT
ELECT REM PT RETURN 9FT ADLT (ELECTROSURGICAL) ×2
ELECTRODE REM PT RTRN 9FT ADLT (ELECTROSURGICAL) ×1 IMPLANT
GAUZE SPONGE 4X4 12PLY STRL LF (GAUZE/BANDAGES/DRESSINGS) IMPLANT
GLOVE SURG ENC MOIS LTX SZ7 (GLOVE) ×4 IMPLANT
GLOVE SURG UNDER POLY LF SZ7.5 (GLOVE) ×2 IMPLANT
GOWN STRL REUS W/ TWL LRG LVL3 (GOWN DISPOSABLE) ×2 IMPLANT
GOWN STRL REUS W/TWL LRG LVL3 (GOWN DISPOSABLE) ×4
HEMOSTAT ARISTA ABSORB 3G PWDR (HEMOSTASIS) IMPLANT
KIT MARKER MARGIN INK (KITS) ×2 IMPLANT
NEEDLE HYPO 25X1 1.5 SAFETY (NEEDLE) ×2 IMPLANT
NS IRRIG 1000ML POUR BTL (IV SOLUTION) ×2 IMPLANT
PACK BASIN DAY SURGERY FS (CUSTOM PROCEDURE TRAY) ×2 IMPLANT
PENCIL SMOKE EVACUATOR (MISCELLANEOUS) ×2 IMPLANT
RETRACTOR ONETRAX LX 90X20 (MISCELLANEOUS) IMPLANT
SLEEVE SCD COMPRESS KNEE MED (MISCELLANEOUS) ×2 IMPLANT
SPONGE LAP 4X18 RFD (DISPOSABLE) ×2 IMPLANT
STRIP CLOSURE SKIN 1/2X4 (GAUZE/BANDAGES/DRESSINGS) ×2 IMPLANT
SUT MNCRL AB 4-0 PS2 18 (SUTURE) IMPLANT
SUT MON AB 5-0 PS2 18 (SUTURE) ×2 IMPLANT
SUT SILK 2 0 SH (SUTURE) IMPLANT
SUT VIC AB 2-0 SH 27 (SUTURE) ×2
SUT VIC AB 2-0 SH 27XBRD (SUTURE) ×1 IMPLANT
SUT VIC AB 3-0 SH 27 (SUTURE) ×2
SUT VIC AB 3-0 SH 27X BRD (SUTURE) ×1 IMPLANT
SYR CONTROL 10ML LL (SYRINGE) ×2 IMPLANT
TOWEL GREEN STERILE FF (TOWEL DISPOSABLE) ×2 IMPLANT
TRAY FAXITRON CT DISP (TRAY / TRAY PROCEDURE) ×2 IMPLANT
TUBE CONNECTING 20X1/4 (TUBING) ×2 IMPLANT
YANKAUER SUCT BULB TIP NO VENT (SUCTIONS) IMPLANT

## 2020-10-11 NOTE — Transfer of Care (Signed)
Immediate Anesthesia Transfer of Care Note  Patient: Stephanie [name]  Procedure(s) Performed: LEFT BREAST RADIOACTIVE SEED GUIDED EXCISIONAL BREAST BIOPSY (Left Breast)  Patient Location: PACU  Anesthesia Type:General  Level of Consciousness: awake, alert  and oriented  Airway & Oxygen Therapy: Patient Spontanous Breathing and Patient connected to face mask oxygen  Post-op Assessment: Report given to RN and Post -op Vital signs reviewed and stable  Post vital signs: Reviewed and stable  Last Vitals:  Vitals Value Taken Time  BP    Temp    Pulse 59 10/11/20 0916  Resp 18 10/11/20 0916  SpO2 100 % 10/11/20 0916  Vitals shown include unvalidated device data.  Last Pain:  Vitals:   10/11/20 0725  TempSrc: Oral  PainSc: 0-No pain         Complications: No complications documented.

## 2020-10-11 NOTE — Anesthesia Postprocedure Evaluation (Signed)
Anesthesia Post Note  Patient: Stephanie [name]  Procedure(s) Performed: LEFT BREAST RADIOACTIVE SEED GUIDED EXCISIONAL BREAST BIOPSY (Left Breast)     Patient location during evaluation: PACU Anesthesia Type: General Level of consciousness: awake and alert Pain management: pain level controlled Vital Signs Assessment: post-procedure vital signs reviewed and stable Respiratory status: spontaneous breathing, nonlabored ventilation, respiratory function stable and patient connected to nasal cannula oxygen Cardiovascular status: blood pressure returned to baseline and stable Postop Assessment: no apparent nausea or vomiting Anesthetic complications: no   No complications documented.  Last Vitals:  Vitals:   10/11/20 0937 10/11/20 0945  BP: 126/75 130/76  Pulse: (!) 58 (!) 58  Resp: 18 18  Temp:  (!) 36.4 C  SpO2: 100% 99%    Last Pain:  Vitals:   10/11/20 0945  TempSrc:   PainSc: 0-No pain                 Tiajuana Amass

## 2020-10-11 NOTE — Anesthesia Preprocedure Evaluation (Addendum)
Anesthesia Evaluation  Patient identified by MRN, date of birth, ID band Patient awake    Reviewed: Allergy & Precautions, NPO status , Patient's Chart, lab work & pertinent test results  Airway Mallampati: II  TM Distance: >3 FB     Dental  (+) Dental Advisory Given   Pulmonary COPD, former smoker,    breath sounds clear to auscultation       Cardiovascular hypertension, Pt. on medications  Rhythm:Regular Rate:Normal     Neuro/Psych negative neurological ROS     GI/Hepatic negative GI ROS, Neg liver ROS,   Endo/Other  negative endocrine ROS  Renal/GU negative Renal ROS     Musculoskeletal   Abdominal   Peds  Hematology negative hematology ROS (+)   Anesthesia Other Findings   Reproductive/Obstetrics                             Anesthesia Physical Anesthesia Plan  ASA: III  Anesthesia Plan: General   Post-op Pain Management:    Induction: Intravenous  PONV Risk Score and Plan: 3 and Ondansetron, Dexamethasone and Midazolam  Airway Management Planned: LMA  Additional Equipment:   Intra-op Plan:   Post-operative Plan: Extubation in OR  Informed Consent: I have reviewed the patients History and Physical, chart, labs and discussed the procedure including the risks, benefits and alternatives for the proposed anesthesia with the patient or authorized representative who has indicated his/her understanding and acceptance.     Dental advisory given  Plan Discussed with: CRNA  Anesthesia Plan Comments:         Anesthesia Quick Evaluation

## 2020-10-11 NOTE — H&P (Signed)
  65 yof no prior breast history, fh of breast cancer in sister age 65 (deceased age 31) who presents with about a 3 month history of some spontaneous left nipple dc. started bloody now brown. she had mm that shows c density breasts. on mm there is an obscured 1.4 cm mass in uoq. US shows a 1.4x1.8 cm mass. axilla negative. 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 she is here to discuss options   Past Surgical History Breast Biopsy  Left.  Diagnostic Studies History Colonoscopy  5-10 years ago Mammogram  within last year Pap Smear  1-5 years ago  Allergies  No Known Drug Allergies   Allergies Reconciled   Medication History  Triamterene-HCTZ (75-50MG  Tablet, Oral) Active. Medications Reconciled  Social History  Alcohol use  Occasional alcohol use. Caffeine use  Coffee. No drug use  Tobacco use  Former smoker.  Family History  Family history unknown  First Degree Relatives  Heart disease in female family member before age 85  Malignant Neoplasm Of Pancreas  Father.  Pregnancy / Birth History  Age at menarche  70 years. Contraceptive History  Depo-provera, Oral contraceptives. Gravida  0 Maternal age  56-25 Para  2 Regular periods   Other Problems  No pertinent past medical history  Oophorectomy    Review of Systems  General Not Present- Appetite Loss, Chills, Fatigue, Fever, Night Sweats, Weight Gain and Weight Loss. Skin Not Present- Change in Wart/Mole, Dryness, Hives, Jaundice, New Lesions, Non-Healing Wounds, Rash and Ulcer. HEENT Not Present- Earache, Hearing Loss, Hoarseness, Nose Bleed, Oral Ulcers, Ringing in the Ears, Seasonal Allergies, Sinus Pain, Sore Throat, Visual Disturbances, Wears glasses/contact lenses and Yellow Eyes. Breast Present- Nipple Discharge. Not Present- Breast Mass, Breast Pain and Skin Changes. Cardiovascular Not Present- Chest Pain, Difficulty Breathing Lying Down, Leg Cramps, Palpitations, Rapid Heart  Rate, Shortness of Breath and Swelling of Extremities. Gastrointestinal Not Present- Abdominal Pain, Bloating, Bloody Stool, Change in Bowel Habits, Chronic diarrhea, Constipation, Difficulty Swallowing, Excessive gas, Gets full quickly at meals, Hemorrhoids, Indigestion, Nausea, Rectal Pain and Vomiting. Female Genitourinary Not Present- Frequency, Nocturia, Painful Urination, Pelvic Pain and Urgency. Musculoskeletal Not Present- Back Pain, Joint Pain, Joint Stiffness, Muscle Pain, Muscle Weakness and Swelling of Extremities. Neurological Not Present- Decreased Memory, Fainting, Headaches, Numbness, Seizures, Tingling, Tremor, Trouble walking and Weakness. Psychiatric Not Present- Anxiety, Bipolar, Change in Sleep Pattern, Depression, Fearful and Frequent crying. Endocrine Not Present- Cold Intolerance, Excessive Hunger, Hair Changes, Heat Intolerance, Hot flashes and New Diabetes. Hematology Not Present- Blood Thinners, Easy Bruising, Excessive bleeding, Gland problems, HIV and Persistent Infections.   Physical Exam  General Mental Status-Alert. Orientation-Oriented X3. Breast Breast Lump-No Palpable Breast Mass. Note: no right sided dc left single duct brown/red discharge elicited on exam no mass Lymphatic Axillary General Axillary Region: Bilateral - Description - Normal.  Assessment & Plan ( NIPPLE DISCHARGE, BLOODY (N64.52) Story: Left breast seed guided excisional biopsy discussed recommendation for surgery given size, bloody dc, family history and location. discussed seed guided excision with recovery. will schedule soon

## 2020-10-11 NOTE — Op Note (Signed)
Preoperative diagnosis: Left breast mass with biopsy c/w papilloma, bloody left nipple dc Postoperative diagnosis: Same as above Procedure: Left breast radioactive seed guided excisional biopsy and duct excision Surgeon: Dr. Serita Grammes Anesthesia: General Estimated blood loss: Minimal Specimens: Left breast tissue containing seed and clip marked with paint Complications: None Drains: None Special count was correct completion Decision to recovery in stable condition  Indications: 64 yof no prior breast history, fh of breast cancer in sister age 47 (deceased age 36) who presents with about a 3 month history of some spontaneous left nipple dc. started bloody now brown. she had mm that shows c density breasts. on mm there is an obscured 1.4 cm mass in uoq. US shows a 1.4x1.8 cm mass. axilla negative. 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 We discussed excision of papilloma and surrounding ductal system.   Procedure: After informed consent was obtained the patient first had a radioactive seed placed. I had these mammograms in the operating room. She was given antibiotics. SCDs were in place. She was then placed under general anesthesia without complication. She was prepped and draped in the standard sterile surgical fashion. Surgical timeout was then performed.  I infiltrated marcaine around the areola and the seed in the central breast.  I then made a periareolar incision to hide the scar later.  I removed the seed and some of the surrounding tissue. I also removed the ductal system up to the nipple.   I then marked this with paint. I then did a mammogram confirming removal of the seed and the clip. Hemostasis was then obtained. I closed the breast tissue with 2-0 Vicryl. The skin was closed with 3-0 Vicryl for Monocryl. Glue and Steri-Strips were applied. She tolerated this well was extubated transferred to recovery in stable condition.

## 2020-10-11 NOTE — Discharge Instructions (Signed)
[facility] Office Phone Number [phone]  BREAST BIOPSY/ PARTIAL MASTECTOMY: POST OP INSTRUCTIONS Take 400 mg of ibuprofen every 8 hours or 650 mg tylenol every 6 hours for next 72 hours then as needed. Use ice several times daily also. Always review your discharge instruction sheet given to you by the facility where your surgery was performed.  IF YOU HAVE DISABILITY OR FAMILY LEAVE FORMS, YOU MUST BRING THEM TO THE OFFICE FOR PROCESSING.  DO NOT GIVE THEM TO YOUR DOCTOR.  1. A prescription for pain medication may be given to you upon discharge.  Take your pain medication as prescribed, if needed.  If narcotic pain medicine is not needed, then you may take acetaminophen (Tylenol), naprosyn (Alleve) or ibuprofen (Advil) as needed. 2. Take your usually prescribed medications unless otherwise directed 3. If you need a refill on your pain medication, please contact your pharmacy.  They will contact our office to request authorization.  Prescriptions will not be filled after 5pm or on week-ends. 4. You should eat very light the first 24 hours after surgery, such as soup, crackers, pudding, etc.  Resume your normal diet the day after surgery. 5. Most patients will experience some swelling and bruising in the breast.  Ice packs and a good support bra will help.  Wear the breast binder provided or a sports bra for 72 hours day and night.  After that wear a sports bra during the day until you return to the office. Swelling and bruising can take several days to resolve.  6. It is common to experience some constipation if taking pain medication after surgery.  Increasing fluid intake and taking a stool softener will usually help or prevent this problem from occurring.  A mild laxative (Milk of Magnesia or Miralax) should be taken according to package directions if there are no bowel movements after 48 hours. 7. Unless discharge instructions indicate otherwise, you may remove your bandages 48  hours after surgery and you may shower at that time.  You may have steri-strips (small skin tapes) in place directly over the incision.  These strips should be left on the skin for 7-10 days and will come off on their own.  If your surgeon used skin glue on the incision, you may shower in 24 hours.  The glue will flake off over the next 2-3 weeks.  Any sutures or staples will be removed at the office during your follow-up visit. 8. ACTIVITIES:  You may resume regular daily activities (gradually increasing) beginning the next day.  Wearing a good support bra or sports bra minimizes pain and swelling.  You may have sexual intercourse when it is comfortable. a. You may drive when you no longer are taking prescription pain medication, you can comfortably wear a seatbelt, and you can safely maneuver your car and apply brakes. b. RETURN TO WORK:  ______________________________________________________________________________________ 9. You should see your doctor in the office for a follow-up appointment approximately two weeks after your surgery.  Your doctor's nurse will typically make your follow-up appointment when she calls you with your pathology report.  Expect your pathology report 3-4 business days after your surgery.  You may call to check if you do not hear from us after three days. 10. OTHER INSTRUCTIONS: _______________________________________________________________________________________________ _____________________________________________________________________________________________________________________________________ _____________________________________________________________________________________________________________________________________ _____________________________________________________________________________________________________________________________________  WHEN TO CALL DR Miri Jose: 1. Fever over 101.0 2. Nausea and/or vomiting. 3. Extreme swelling or  bruising. 4. Continued bleeding from incision. 5. Increased pain, redness, or drainage from the incision.  The clinic   staff is available to answer your questions during regular business hours.  Please don't hesitate to call and ask to speak to one of the nurses for clinical concerns.  If you have a medical emergency, go to the nearest emergency room or call 911.  A surgeon from [facility] is always on call at the hospital.  For further questions, please visit [email or web address] Next dose of Tylenol can be given after 1:30PM. Next dose of NSAID (Ibuprofen, Aleve, Motrin) can be given after 1:30PM.     Post Anesthesia Home Care Instructions  Activity: Get plenty of rest for the remainder of the day. A responsible individual must stay with you for 24 hours following the procedure.  For the next 24 hours, DO NOT: -Drive a car -Paediatric nurse -Drink alcoholic beverages -Take any medication unless instructed by your physician -Make any legal decisions or sign important papers.  Meals: Start with liquid foods such as gelatin or soup. Progress to regular foods as tolerated. Avoid greasy, spicy, heavy foods. If nausea and/or vomiting occur, drink only clear liquids until the nausea and/or vomiting subsides. Call your physician if vomiting continues.  Special Instructions/Symptoms: Your throat may feel dry or sore from the anesthesia or the breathing tube placed in your throat during surgery. If this causes discomfort, gargle with warm salt water. The discomfort should disappear within 24 hours.  If you had a scopolamine patch placed behind your ear for the management of post- operative nausea and/or vomiting:  1. The medication in the patch is effective for 72 hours, after which it should be removed.  Wrap patch in a tissue and discard in the trash. Wash hands thoroughly with soap and water. 2. You may remove the patch earlier than 72 hours if you experience  unpleasant side effects which may include dry mouth, dizziness or visual disturbances. 3. Avoid touching the patch. Wash your hands with soap and water after contact with the patch.

## 2020-10-11 NOTE — Interval H&P Note (Signed)
History and Physical Interval Note:  10/11/2020 8:18 AM  Stephanie [name]  has presented today for surgery, with the diagnosis of LEFT BREAST MASS.  The various methods of treatment have been discussed with the patient and family. After consideration of risks, benefits and other options for treatment, the patient has consented to  Procedure(s): LEFT BREAST RADIOACTIVE SEED GUIDED EXCISIONAL BREAST BIOPSY (Left) as a surgical intervention.  The patient's history has been reviewed, patient examined, no change in status, stable for surgery.  I have reviewed the patient's chart and labs.  Questions were answered to the patient's satisfaction.     Rolm Bookbinder

## 2020-10-11 NOTE — Anesthesia Procedure Notes (Signed)
Procedure Name: LMA Insertion Performed by: Crisanto Nied M, CRNA Pre-anesthesia Checklist: Patient identified, Emergency Drugs available, Suction available and Patient being monitored Patient Re-evaluated:Patient Re-evaluated prior to induction Oxygen Delivery Method: Circle system utilized Preoxygenation: Pre-oxygenation with 100% oxygen Induction Type: IV induction Ventilation: Mask ventilation without difficulty LMA: LMA inserted LMA Size: 3.0 Number of attempts: 1 Airway Equipment and Method: Bite block Placement Confirmation: positive ETCO2 Tube secured with: Tape Dental Injury: Teeth and Oropharynx as per pre-operative assessment        

## 2020-10-12 ENCOUNTER — Encounter (HOSPITAL_BASED_OUTPATIENT_CLINIC_OR_DEPARTMENT_OTHER): Payer: Self-pay | Admitting: General Surgery

## 2020-10-15 LAB — SURGICAL PATHOLOGY

## 2021-02-13 ENCOUNTER — Other Ambulatory Visit: Payer: Self-pay | Admitting: General Surgery

## 2021-02-13 DIAGNOSIS — N644 Mastodynia: Secondary | ICD-10-CM

## 2021-03-14 ENCOUNTER — Ambulatory Visit
Admission: RE | Admit: 2021-03-14 | Discharge: 2021-03-14 | Disposition: A | Discharge status: Home / Self Care | Payer: No Typology Code available for payment source | Source: Ambulatory Visit | Attending: General Surgery | Admitting: General Surgery

## 2021-03-14 ENCOUNTER — Ambulatory Visit: Payer: No Typology Code available for payment source

## 2021-03-14 ENCOUNTER — Other Ambulatory Visit: Payer: Self-pay

## 2021-03-14 DIAGNOSIS — N644 Mastodynia: Secondary | ICD-10-CM

## 2021-07-10 ENCOUNTER — Encounter (HOSPITAL_COMMUNITY)
Admission: RE | Admit: 2021-07-10 | Discharge: 2021-07-10 | Disposition: A | Discharge status: Home / Self Care | Payer: No Typology Code available for payment source | Source: Ambulatory Visit | Attending: Ophthalmology | Admitting: Ophthalmology

## 2021-07-10 ENCOUNTER — Other Ambulatory Visit: Payer: Self-pay

## 2021-07-10 NOTE — H&P (Signed)
Surgical History & Physical  Patient Name: Stephanie [name] DOB: [birth year]  Surgery: Cataract extraction with intraocular lens implant phacoemulsification; Left Eye  Surgeon: Baruch Goldmann MD Surgery Date:  07-12-21 Pre-Op Date:  06-20-21  HPI: A 36 Yr. old female patient Pt is present for cataract evaluation (originally referred by Dr. Rosana Hoes) The patient complains of difficulty when driving at night, which began 3 years ago. Both eyes are affected, but left eye much moreso. The episode is gradual. The condition's severity is worsening. The complaint is associated with blurry vision, glare and halos. Pt has decrease night driving due to vision. Symptoms are negatively affecting pt's quality of life. Pt denies any eye drop use or increase in floaters/flashes of light. HPI was performed by Baruch Goldmann .  Medical History: Cataracts Floaters, Retinal drusen OU Keratoacanthoma Macula Degeneration Glaucoma High Blood Pressure  Review of Systems Negative Allergic/Immunologic Negative Cardiovascular Negative Constitutional Negative Ear, Nose, Mouth & Throat Negative Endocrine Negative Eyes Negative Gastrointestinal Negative Genitourinary Negative Hemotologic/Lymphatic Negative Integumentary Negative Musculoskeletal Negative Neurological Negative Psychiatry Negative Respiratory  Social   Former smoker   Medication Triamterene, Calcium, Multivitamin,   Sx/Procedures Laparoscopic salpingectomy, Breast Biopsy,   Drug Allergies   NKDA  History & Physical: Heent: Cataract, Left NECK: supple without bruits LUNGS: lungs clear to auscultation CV: regular rate and rhythm Abdomen: soft and non-tender  Impression & Plan: Assessment: 1.  CATARACT HYPERMATURE (MORGAGNIAN) AGE RELATED; Left Eye (H25.22) 2.  COMBINED FORMS AGE RELATED CATARACT; Right Eye (H25.811) 3.  BLEPHARITIS; Right Upper Lid, Right Lower Lid, Left Upper Lid, Left Lower Lid (H01.001,  H01.002,H01.004,H01.005)  Plan: 1.  Cataract accounts for the patient's decreased vision. This visual impairment is not correctable with a tolerable change in glasses or contact lenses. Cataract surgery with an implantation of a new lens should significantly improve the visual and functional status of the patient. Discussed all risks, benefits, alternatives, and potential complications. Discussed the procedures and recovery. Patient desires to have surgery. A-scan ordered and performed today for intra-ocular lens calculations. The surgery will be performed in order to improve vision for driving, reading, and for eye examinations. Recommend phacoemulsification with intra-ocular lens. Recommend Dextenza for post-operative pain and inflammation. Left Eye Dilates moderately - shugarcaine by protocol. [facility]. Malyugin Ring. Omidira.  2.  Right eye is 20/20 - patient will monitor with Dr. Rosana Hoes and can return any time for surgery OD.  3.  Recommend regular lid cleaning.

## 2021-07-12 ENCOUNTER — Encounter (HOSPITAL_COMMUNITY): Admission: RE | Payer: Self-pay | Source: Home / Self Care

## 2021-07-12 ENCOUNTER — Ambulatory Visit (HOSPITAL_COMMUNITY): Admission: RE | Admit: 2021-07-12 | Payer: Medicare Other | Source: Home / Self Care | Admitting: Ophthalmology

## 2021-07-12 SURGERY — PHACOEMULSIFICATION, CATARACT, WITH IOL INSERTION
Anesthesia: Monitor Anesthesia Care | Laterality: Left

## 2021-08-20 ENCOUNTER — Encounter (HOSPITAL_COMMUNITY)
Admission: RE | Admit: 2021-08-20 | Discharge: 2021-08-20 | Disposition: A | Discharge status: Home / Self Care | Payer: Medicare Other | Source: Ambulatory Visit | Attending: Ophthalmology | Admitting: Ophthalmology

## 2021-08-20 ENCOUNTER — Encounter (HOSPITAL_COMMUNITY): Payer: Self-pay

## 2021-08-20 ENCOUNTER — Other Ambulatory Visit: Payer: Self-pay

## 2021-08-20 NOTE — H&P (Signed)
Surgical History & Physical  Patient Name: Stephanie [name] DOB: [birth year]  Surgery: Cataract extraction with intraocular lens implant phacoemulsification; Left Eye  Surgeon: Baruch Goldmann MD Surgery Date:  08-23-21 Pre-Op Date:  08-12-21  HPI: A 42 Yr. old female patient Pt is present for cataract evaluation (originally referred by Dr. Rosana Hoes) The patient complains of difficulty when driving at night, which began 3 years ago. Both eyes are affected, but left eye much moreso. The episode is gradual. The condition's severity is worsening. The complaint is associated with blurry vision, glare and halos. Pt has decrease night driving due to vision. Symptoms are negatively affecting pt's quality of life. Pt denies any eye drop use or increase in floaters/flashes of light. HPI was performed by Baruch Goldmann .  Medical History: Cataracts Floaters, Retinal drusen OU Keratoacanthoma Macula Degeneration Glaucoma High Blood Pressure  Review of Systems Negative Allergic/Immunologic Negative Cardiovascular Negative Constitutional Negative Ear, Nose, Mouth & Throat Negative Endocrine Negative Eyes Negative Gastrointestinal Negative Genitourinary Negative Hemotologic/Lymphatic Negative Integumentary Negative Musculoskeletal Negative Neurological Negative Psychiatry Negative Respiratory  Social   Former smoker   Medication Triamterene, Calcium, Multivitamin,   Sx/Procedures Laparoscopic salpingectomy, Breast Biopsy,   Drug Allergies   NKDA  History & Physical: Heent: Cataract, Left Eye NECK: supple without bruits LUNGS: lungs clear to auscultation CV: regular rate and rhythm Abdomen: soft and non-tender  Impression & Plan: Assessment: 1.  CATARACT HYPERMATURE (MORGAGNIAN) AGE RELATED; Left Eye (H25.22) 2.  COMBINED FORMS AGE RELATED CATARACT; Right Eye (H25.811) 3.  BLEPHARITIS; Right Upper Lid, Right Lower Lid, Left Upper Lid, Left Lower Lid (H01.001,  H01.002,H01.004,H01.005)  Plan: 1.  Cataract accounts for the patient's decreased vision. This visual impairment is not correctable with a tolerable change in glasses or contact lenses. Cataract surgery with an implantation of a new lens should significantly improve the visual and functional status of the patient. Discussed all risks, benefits, alternatives, and potential complications. Discussed the procedures and recovery. Patient desires to have surgery. A-scan ordered and performed today for intra-ocular lens calculations. The surgery will be performed in order to improve vision for driving, reading, and for eye examinations. Recommend phacoemulsification with intra-ocular lens. Recommend Dextenza for post-operative pain and inflammation. Left Eye Dilates moderately - shugarcaine by protocol. [facility]. Malyugin Ring. Omidira.  2.  Right eye is 20/20 - patient will monitor with Dr. Rosana Hoes and can return any time for surgery OD.  3.  Recommend regular lid cleaning.

## 2021-08-23 ENCOUNTER — Encounter (HOSPITAL_COMMUNITY): Payer: Self-pay | Admitting: Ophthalmology

## 2021-08-23 ENCOUNTER — Ambulatory Visit (HOSPITAL_COMMUNITY)
Admission: RE | Admit: 2021-08-23 | Discharge: 2021-08-23 | Disposition: A | Discharge status: Home / Self Care | Payer: Medicare Other | Attending: Ophthalmology | Admitting: Ophthalmology

## 2021-08-23 ENCOUNTER — Encounter (HOSPITAL_COMMUNITY)
Admission: RE | Disposition: A | Discharge status: Home / Self Care | Payer: Self-pay | Source: Home / Self Care | Attending: Ophthalmology

## 2021-08-23 ENCOUNTER — Ambulatory Visit (HOSPITAL_COMMUNITY): Payer: Medicare Other | Admitting: Anesthesiology

## 2021-08-23 ENCOUNTER — Other Ambulatory Visit: Payer: Self-pay

## 2021-08-23 DIAGNOSIS — I10 Essential (primary) hypertension: Secondary | ICD-10-CM | POA: Diagnosis not present

## 2021-08-23 DIAGNOSIS — H2522 Age-related cataract, morgagnian type, left eye: Secondary | ICD-10-CM | POA: Diagnosis present

## 2021-08-23 DIAGNOSIS — J449 Chronic obstructive pulmonary disease, unspecified: Secondary | ICD-10-CM | POA: Diagnosis not present

## 2021-08-23 DIAGNOSIS — Z87891 Personal history of nicotine dependence: Secondary | ICD-10-CM | POA: Insufficient documentation

## 2021-08-23 HISTORY — PX: CATARACT EXTRACTION W/PHACO: SHX586

## 2021-08-23 SURGERY — PHACOEMULSIFICATION, CATARACT, WITH IOL INSERTION
Anesthesia: Monitor Anesthesia Care | Site: Eye | Laterality: Left

## 2021-08-23 MED ORDER — POVIDONE-IODINE 5 % OP SOLN
OPHTHALMIC | Status: DC | PRN
  Administered 2021-08-23: 1 via OPHTHALMIC

## 2021-08-23 MED ORDER — TETRACAINE HCL 0.5 % OP SOLN
1.0000 [drp] | OPHTHALMIC | Status: AC | PRN
  Administered 2021-08-23 (×3): 1 [drp] via OPHTHALMIC

## 2021-08-23 MED ORDER — SODIUM HYALURONATE 23MG/ML IO SOSY
PREFILLED_SYRINGE | INTRAOCULAR | Status: DC | PRN
  Administered 2021-08-23: 0.6 mL via INTRAOCULAR

## 2021-08-23 MED ORDER — STERILE WATER FOR IRRIGATION IR SOLN
Status: DC | PRN
  Administered 2021-08-23: 250 mL

## 2021-08-23 MED ORDER — PHENYLEPHRINE-KETOROLAC 1-0.3 % IO SOLN
INTRAOCULAR | Status: DC | PRN
  Administered 2021-08-23: 500 mL via OPHTHALMIC

## 2021-08-23 MED ORDER — PHENYLEPHRINE-KETOROLAC 1-0.3 % IO SOLN
INTRAOCULAR | Status: AC
  Filled 2021-08-23: qty 4

## 2021-08-23 MED ORDER — EPINEPHRINE PF 1 MG/ML IJ SOLN
INTRAMUSCULAR | Status: AC
  Filled 2021-08-23: qty 1

## 2021-08-23 MED ORDER — LIDOCAINE HCL (PF) 1 % IJ SOLN
INTRAOCULAR | Status: DC | PRN
  Administered 2021-08-23: 1 mL via OPHTHALMIC

## 2021-08-23 MED ORDER — BSS IO SOLN
INTRAOCULAR | Status: DC | PRN
  Administered 2021-08-23: 15 mL via INTRAOCULAR

## 2021-08-23 MED ORDER — TRYPAN BLUE 0.06 % IO SOSY
PREFILLED_SYRINGE | INTRAOCULAR | Status: DC | PRN
  Administered 2021-08-23: 0.5 mL via INTRAOCULAR

## 2021-08-23 MED ORDER — SODIUM HYALURONATE 10 MG/ML IO SOLUTION
PREFILLED_SYRINGE | INTRAOCULAR | Status: DC | PRN
  Administered 2021-08-23: 0.85 mL via INTRAOCULAR

## 2021-08-23 MED ORDER — LIDOCAINE HCL 3.5 % OP GEL
1.0000 "application " | Freq: Once | OPHTHALMIC | Status: AC
  Administered 2021-08-23: 1 via OPHTHALMIC

## 2021-08-23 MED ORDER — TROPICAMIDE 1 % OP SOLN
1.0000 [drp] | OPHTHALMIC | Status: AC | PRN
  Administered 2021-08-23 (×3): 1 [drp] via OPHTHALMIC
  Filled 2021-08-23: qty 2

## 2021-08-23 MED ORDER — PHENYLEPHRINE HCL 2.5 % OP SOLN
1.0000 [drp] | OPHTHALMIC | Status: AC | PRN
  Administered 2021-08-23 (×3): 1 [drp] via OPHTHALMIC

## 2021-08-23 MED ORDER — TRYPAN BLUE 0.06 % IO SOSY
PREFILLED_SYRINGE | INTRAOCULAR | Status: AC
  Filled 2021-08-23: qty 0.5

## 2021-08-23 MED ORDER — NEOMYCIN-POLYMYXIN-DEXAMETH 3.5-10000-0.1 OP SUSP
OPHTHALMIC | Status: DC | PRN
  Administered 2021-08-23: 1 [drp] via OPHTHALMIC

## 2021-08-23 SURGICAL SUPPLY — 10 items
CLOTH BEACON ORANGE TIMEOUT ST (SAFETY) ×2 IMPLANT
EYE SHIELD UNIVERSAL CLEAR (GAUZE/BANDAGES/DRESSINGS) ×2 IMPLANT
GLOVE SURG UNDER POLY LF SZ7 (GLOVE) ×4 IMPLANT
NEEDLE HYPO 18GX1.5 BLUNT FILL (NEEDLE) ×2 IMPLANT
PAD ARMBOARD 7.5X6 YLW CONV (MISCELLANEOUS) ×2 IMPLANT
RayOne EMV US (Intraocular Lens) ×2 IMPLANT
SYR TB 1ML LL NO SAFETY (SYRINGE) ×2 IMPLANT
TAPE SURG TRANSPORE 1 IN (GAUZE/BANDAGES/DRESSINGS) ×1 IMPLANT
TAPE SURGICAL TRANSPORE 1 IN (GAUZE/BANDAGES/DRESSINGS) ×2
WATER STERILE IRR 250ML POUR (IV SOLUTION) ×2 IMPLANT

## 2021-08-23 NOTE — Interval H&P Note (Signed)
History and Physical Interval Note:  08/23/2021 1:18 PM  Stephanie [name]  has presented today for surgery, with the diagnosis of nuclear cataract left eye.  The various methods of treatment have been discussed with the patient and family. After consideration of risks, benefits and other options for treatment, the patient has consented to  Procedure(s) with comments: CATARACT EXTRACTION PHACO AND INTRAOCULAR LENS PLACEMENT ([place]) (Left) - left as a surgical intervention.  The patient's history has been reviewed, patient examined, no change in status, stable for surgery.  I have reviewed the patient's chart and labs.  Questions were answered to the patient's satisfaction.     Baruch Goldmann

## 2021-08-23 NOTE — Discharge Instructions (Signed)
Please discharge patient when stable, will follow up today with Dr. Jennavie Martinek at the [facility] office immediately following discharge.  Leave shield in place until visit.  All paperwork with discharge instructions will be given at the office.  [facility] Address:  [address]  , [place] [address]  

## 2021-08-23 NOTE — Op Note (Signed)
Date of procedure: 08/23/21  Pre-operative diagnosis: Mature, Visually significant age-related cataract, Left Eye (H25.22)  Post-operative diagnosis: Mature Visually significant age-related cataract, Left Eye  Procedure: Complex Removal of cataract via phacoemulsification and insertion of intra-ocular lensRayner [id number]  +21.5D into the capsular bag of the Left Eye  Attending surgeon: Gerda Diss. Nadir Vasques, MD, MA  Anesthesia: MAC, Topical Akten  Complications: None  Estimated Blood Loss: <18m (minimal)  Specimens: None  Implants: As above  Indications:  Mature Visually significant age-related cataract, Left Eye  Procedure:  The patient was seen and identified in the pre-operative area. The operative eye was identified and dilated.  The operative eye was marked.  Topical anesthesia was administered to the operative eye.     The patient was then to the operative suite and placed in the supine position.  A timeout was performed confirming the patient, procedure to be performed, and all other relevant information.   The patient's face was prepped and draped in the usual fashion for intra-ocular surgery.  A lid speculum was placed into the operative eye and the surgical microscope moved into place and focused.  A lack of red reflex due to a mature cataract was confirmed.  An inferotemporal paracentesis was created using a 20 gauge paracentesis blade.  Vision blue was injected into the anterior chamber.  Shugarcaine was injected into the anterior chamber.  Viscoelastic was injected into the anterior chamber.  A temporal clear-corneal main wound incision was created using a 2.445mmicrokeratome.  A continuous curvilinear capsulorrhexis was initiated using an irrigating cystitome and completed using capsulorrhexis forceps.  Hydrodissection and hydrodeliniation were performed.  Viscoelastic was injected into the anterior chamber.  A phacoemulsification handpiece and a chopper as a second instrument were  used to remove the nucleus and epinucleus. The irrigation/aspiration handpiece was used to remove any remaining cortical material.   The capsular bag was reinflated with viscoelastic, checked, and found to be intact. The intraocular lens was inserted into the capsular bag and dialed into place using a kuglen hook.  The irrigation/aspiration handpiece was used to remove any remaining viscoelastic.  The clear corneal wound and paracentesis wounds were then hydrated and checked with Weck-Cels to be watertight.  The lid-speculum and drape was removed, and the patient's face was cleaned with a wet and dry 4x4.  Maxitrol was instilled in the eye before a clear shield was taped over the eye. The patient was taken to the post-operative care unit in good condition, having tolerated the procedure well.  Post-Op Instructions: [facility] and will receive all other orders and instructions.

## 2021-08-23 NOTE — Anesthesia Postprocedure Evaluation (Signed)
Anesthesia Post Note  Patient: Stephanie [name]  Procedure(s) Performed: CATARACT EXTRACTION PHACO AND INTRAOCULAR LENS PLACEMENT (IOC) (Left: Eye)  Patient location during evaluation: Phase II Anesthesia Type: MAC Level of consciousness: awake Pain management: pain level controlled Vital Signs Assessment: post-procedure vital signs reviewed and stable Respiratory status: spontaneous breathing and respiratory function stable Cardiovascular status: blood pressure returned to baseline and stable Postop Assessment: no headache and no apparent nausea or vomiting Anesthetic complications: no Comments: Late entry   No notable events documented.   Last Vitals:  Vitals:   08/23/21 1145 08/23/21 1400  BP: 130/80 129/85  Pulse: 72 71  Resp: 15 18  Temp: 36.5 C 37.1 C  SpO2: 95% 98%    Last Pain:  Vitals:   08/23/21 1400  TempSrc: Oral  PainSc: 0-No pain                 Louann Sjogren

## 2021-08-23 NOTE — Transfer of Care (Signed)
Immediate Anesthesia Transfer of Care Note  Patient: Stephanie [name]  Procedure(s) Performed: CATARACT EXTRACTION PHACO AND INTRAOCULAR LENS PLACEMENT (IOC) (Left: Eye)  Patient Location: Short Stay  Anesthesia Type:MAC  Level of Consciousness: awake, alert  and oriented  Airway & Oxygen Therapy: Patient Spontanous Breathing  Post-op Assessment: Report given to RN and Post -op Vital signs reviewed and stable  Post vital signs: Reviewed and stable  Last Vitals:  Vitals Value Taken Time  BP 129/85 08/23/21 1400  Temp 37.1 C 08/23/21 1400  Pulse 71 08/23/21 1400  Resp 18 08/23/21 1400  SpO2 98 % 08/23/21 1400    Last Pain:  Vitals:   08/23/21 1400  TempSrc: Oral  PainSc: 0-No pain      Patients Stated Pain Goal: 5 (27/51/70 0174)  Complications: No notable events documented.

## 2021-08-23 NOTE — Anesthesia Preprocedure Evaluation (Signed)
Anesthesia Evaluation  Patient identified by MRN, date of birth, ID band Patient awake    Reviewed: Allergy & Precautions, H&P , NPO status , Patient's Chart, lab work & pertinent test results, reviewed documented beta blocker date and time   Airway Mallampati: II  TM Distance: >3 FB Neck ROM: full    Dental no notable dental hx.    Pulmonary COPD, former smoker,    Pulmonary exam normal breath sounds clear to auscultation       Cardiovascular Exercise Tolerance: Good hypertension, negative cardio ROS   Rhythm:regular Rate:Normal     Neuro/Psych negative neurological ROS  negative psych ROS   GI/Hepatic negative GI ROS, Neg liver ROS,   Endo/Other  negative endocrine ROS  Renal/GU negative Renal ROS  negative genitourinary   Musculoskeletal   Abdominal   Peds  Hematology negative hematology ROS (+)   Anesthesia Other Findings   Reproductive/Obstetrics negative OB ROS                             Anesthesia Physical Anesthesia Plan  ASA: 3  Anesthesia Plan: MAC   Post-op Pain Management:    Induction:   PONV Risk Score and Plan:   Airway Management Planned:   Additional Equipment:   Intra-op Plan:   Post-operative Plan:   Informed Consent: I have reviewed the patients History and Physical, chart, labs and discussed the procedure including the risks, benefits and alternatives for the proposed anesthesia with the patient or authorized representative who has indicated his/her understanding and acceptance.     Dental Advisory Given  Plan Discussed with: CRNA  Anesthesia Plan Comments:         Anesthesia Quick Evaluation

## 2021-08-26 ENCOUNTER — Encounter (HOSPITAL_COMMUNITY): Payer: Self-pay | Admitting: Ophthalmology

## 2021-09-25 ENCOUNTER — Encounter (INDEPENDENT_AMBULATORY_CARE_PROVIDER_SITE_OTHER): Payer: Self-pay | Admitting: *Deleted

## 2021-10-11 ENCOUNTER — Encounter (INDEPENDENT_AMBULATORY_CARE_PROVIDER_SITE_OTHER): Payer: Self-pay | Admitting: *Deleted

## 2021-12-10 ENCOUNTER — Other Ambulatory Visit: Payer: Self-pay

## 2021-12-10 ENCOUNTER — Encounter (INDEPENDENT_AMBULATORY_CARE_PROVIDER_SITE_OTHER): Payer: Self-pay | Admitting: Gastroenterology

## 2021-12-10 ENCOUNTER — Telehealth (INDEPENDENT_AMBULATORY_CARE_PROVIDER_SITE_OTHER): Payer: Self-pay

## 2021-12-10 ENCOUNTER — Other Ambulatory Visit (INDEPENDENT_AMBULATORY_CARE_PROVIDER_SITE_OTHER): Payer: Self-pay

## 2021-12-10 ENCOUNTER — Encounter (INDEPENDENT_AMBULATORY_CARE_PROVIDER_SITE_OTHER): Payer: Self-pay

## 2021-12-10 ENCOUNTER — Ambulatory Visit (INDEPENDENT_AMBULATORY_CARE_PROVIDER_SITE_OTHER): Payer: Medicare Other | Admitting: Gastroenterology

## 2021-12-10 VITALS — BP 142/85 | HR 92 | Temp 98.1°F | Ht 60.0 in | Wt 113.6 lb

## 2021-12-10 DIAGNOSIS — K625 Hemorrhage of anus and rectum: Secondary | ICD-10-CM

## 2021-12-10 DIAGNOSIS — K921 Melena: Secondary | ICD-10-CM | POA: Diagnosis not present

## 2021-12-10 DIAGNOSIS — K5900 Constipation, unspecified: Secondary | ICD-10-CM | POA: Diagnosis not present

## 2021-12-10 MED ORDER — PEG 3350-KCL-NA BICARB-NACL 420 G PO SOLR: 4000.0000 mL | ORAL | 0 refills | Status: DC

## 2021-12-10 NOTE — H&P (View-Only) (Signed)
? ?Referring Provider: Merrilee Seashore, MD ?Primary Care Physician:  Merrilee Seashore, MD ?Primary GI Physician: new ? ?Chief Complaint  ?Patient presents with  ? Rectal Bleeding  ?  Patient here today with complaints of rectal bleeding that she states she had several months ago. She is also having dark stools.  ? ?HPI:   ?Stephanie [name] is a 66 y.o. female with past medical history of COPD, HTN, chronic Hep B, emphysema, constipation.  ? ?Patient presenting today as a new patient for rectal bleeding/melena. ? ?Patient states for the past few months she has had darker stools, sometimes black in color, this is now occurring everytime she has a BM. She reports she is having a BM maybe every other day or every two days. She does take senna to help with constipation. Had KUB done by PCP that showed a lot of stool in her colon, she was recommended to start metamucil but did not notice much results from this. Does endorse needing to strain to have a BM. Does not have any rectal pain. She has noticed 2 episodes of BRB dripping out of her rectum after having a BM. Denies any abdominal pain. Denies any weight loss or changes in her appetite. Denies nausea or vomiting. She does have issues with acid reflux, she reports especially if she eats later. Endorses this occurs maybe once per week, usually with acid regurgitation. She takes tums which seems to help her symptoms. Denies bloating or post prandial abdominal pain but does note some early satiety.  ? ?NSAID use: none ?Social hx: occasionally has a drink, no tobacco, stopped 11 years ago  ?Fam hx: no CRC ? ?Last Colonoscopy:October 2011-redundant colon, 103m polyp in sigmoid colon ?Last Endoscopy:never ? ? ?Past Medical History:  ?Diagnosis Date  ? COPD (chronic obstructive pulmonary disease) [place]   ? quit smoking 8 yrs ago  ? COVID 20272 ? no complications per patient  ? Hypertension   ? Positive TB test many yrs ago  ? Right ovarian cyst   ? Sinusitis   ? occ   ? ? ?Past Surgical History:  ?Procedure Laterality Date  ? bronscopy  2000  ? CATARACT EXTRACTION W/PHACO Left 08/23/2021  ? Procedure: CATARACT EXTRACTION PHACO AND INTRAOCULAR LENS PLACEMENT (IOC);  Surgeon: WBaruch Goldmann MD;  Location: [facility];  Service: Ophthalmology;  Laterality: Left;  CDE 73.40  ? colonscopy  2013  ? HYSTEROSCOPY N/A 08/26/2019  ? Procedure: HYSTEROSCOPY;  Surgeon: MCheri Fowler MD;  [facility]  Service: Gynecology;  Laterality: N/A;  ? LAPAROSCOPIC UNILATERAL SALPINGO OOPHERECTOMY Right 08/26/2019  ? Procedure: LAPAROSCOPIC BILATERAL SALPINGO OOPHORECTOMY;  Surgeon: MCheri Fowler MD;  [facility]  Service: Gynecology;  Laterality: Right;  ? RADIOACTIVE SEED GUIDED EXCISIONAL BREAST BIOPSY Left 10/11/2020  ? Procedure: LEFT BREAST RADIOACTIVE SEED GUIDED EXCISIONAL BREAST BIOPSY;  Surgeon: WRolm Bookbinder MD;  [place]  Service: General;  Laterality: Left;  ? ? ?Current Outpatient Medications  ?Medication Sig Dispense Refill  ? acetaminophen (TYLENOL) 650 MG CR tablet Take 650 mg by mouth every 8 (eight) hours as needed for pain.    ? Calcium Carb-Cholecalciferol (CALCIUM 600 + D PO) Take 1 tablet by mouth every evening.    ? Multiple Vitamin (MULTIVITAMIN WITH MINERALS) TABS tablet Take 1 tablet by mouth every evening.    ? senna (SENOKOT) 8.6 MG tablet Take 1 tablet by mouth daily as needed for constipation.    ? triamterene-hydrochlorothiazide (  MAXZIDE) 75-50 MG tablet Take 0.5 tablets by mouth every evening.    ? ?No current facility-administered medications for this visit.  ? ? ?Allergies as of 12/10/2021  ? (No Known Allergies)  ? ? ?Family History  ?Problem Relation Age of Onset  ? Breast cancer Sister   ? ? ?Social History  ? ?Socioeconomic History  ? Marital status: Widowed  ?  Spouse name: Not on file  ? Number of children: Not on file  ? Years of education: Not on file  ? Highest education  level: Not on file  ?Occupational History  ? Not on file  ?Tobacco Use  ? Smoking status: Former  ?  Packs/day: 1.00  ?  Years: 50.00  ?  Pack years: 50.00  ?  Types: Cigarettes  ? Smokeless tobacco: Never  ? Tobacco comments:  ?  quit 8 yrs ago  ?Vaping Use  ? Vaping Use: Never used  ?Substance and Sexual Activity  ? Alcohol use: Yes  ?  Comment: occ  ? Drug use: Never  ? Sexual activity: Not on file  ?Other Topics Concern  ? Not on file  ?Social History Narrative  ? Not on file  ? ?Social Determinants of Health  ? ?Financial Resource Strain: Not on file  ?Food Insecurity: Not on file  ?Transportation Needs: Not on file  ?Physical Activity: Not on file  ?Stress: Not on file  ?Social Connections: Not on file  ? ?Review of systems ?General: negative for malaise, night sweats, fever, chills, weight loss ?Neck: Negative for lumps, goiter, pain and significant neck swelling ?Resp: Negative for cough, wheezing, dyspnea at rest ?CV: Negative for chest pain, leg swelling, palpitations, orthopnea ?GI: denies  nausea, vomiting, diarrhea,  dysphagia, odyonophagia, or unintentional weight loss. +constipation +melena +BRBPR +early satiety ?MSK: Negative for joint pain or swelling, back pain, and muscle pain. ?Derm: Negative for itching or rash ?Psych: Denies depression, anxiety, memory loss, confusion. No homicidal or suicidal ideation.  ?Heme: Negative for prolonged bleeding, bruising easily, and swollen nodes. ?Endocrine: Negative for cold or heat intolerance, polyuria, polydipsia and goiter. ?Neuro: negative for tremor, gait imbalance, syncope and seizures. ?The remainder of the review of systems is noncontributory. ? ?Physical Exam: ?BP (!) 142/85 (BP Location: Left Arm, Patient Position: Sitting, Cuff Size: Small)   Pulse 92   Temp 98.1 ?F (36.7 ?C) (Oral)   Ht 5' (1.524 m)   Wt 113 lb 9.6 oz (51.5 kg)   BMI 22.19 kg/m?  ?General:   Alert and oriented. No distress noted. Pleasant and cooperative.  ?Head:   Normocephalic and atraumatic. ?Eyes:  Conjuctiva clear without scleral icterus. ?Mouth:  Oral mucosa pink and moist. Good dentition. No lesions. ?Heart: Normal rate and rhythm, s1 and s2 heart sounds present.  ?Lungs: Clear lung sounds in all lobes. Respirations equal and unlabored. ?Abdomen:  +BS, soft, non-tender and non-distended. No rebound or guarding. No HSM or masses noted. ?Derm: No palmar erythema or jaundice ?Msk:  Symmetrical without gross deformities. Normal posture. ?Extremities:  Without edema. ?Neurologic:  Alert and  oriented x4 ?Psych:  Alert and cooperative. Normal mood and affect. ? ?Invalid input(s): 6 MONTHS  ? ?ASSESSMENT: ?Stephanie [name] is a 66 y.o. female presenting today as a new patient for rectal bleeding/melena/constipation. ? ?Darker/black stools, now occurring almost everytime she has a BM, two episodes of BRBPR after having a BM. Does endorse some early satiety as well. No weight loss, changes in appetite, nausea, vomiting or dysphagia. Does not  drink or use NSAIDs. Has occasional reflux symptoms. Will get patient scheduled for EGD and colonoscopy for further evaluation of melena/rectal bleeding and early satiety as we cannot rule out AVMs, bleeding polyps, gastritis, or less likely malignancy. Indications, risks and benefits of procedure discussed in detail with patient. Patient verbalized understanding and is in agreement to proceed with EGD and Colonoscopy at this time.  ? ?Does endorse constipation with recent imaging showing stool throughout the colon. She is using senna for her constipation with some results. I will attempt to obtain these results for her chart as well as recent labs. I discussed continuing to drink plenty of water and eating diet high in fruits, veggies and whole grains. She can also start miralax with dosing instructions provided.  ? ?PLAN:  ?Schedule EGD and Colonoscopy ?2. Start taking Miralax 1 capful every day for one week. If bowel movements do not  improve, increase to 1 capful every 12 hours. If after two weeks there is no improvement, increase to 1 capful every 8 hours ?3. Obtain KUB results from PCP and labs ?4. Diet high in water, fruits, veggies and whole grains ?5.

## 2021-12-10 NOTE — Telephone Encounter (Signed)
Vina Byrd Ann Tinslee Klare, CMA  ?

## 2021-12-10 NOTE — Progress Notes (Signed)
? ?Referring Provider: Merrilee Seashore, MD ?Primary Care Physician:  Merrilee Seashore, MD ?Primary GI Physician: new ? ?Chief Complaint  ?Patient presents with  ? Rectal Bleeding  ?  Patient here today with complaints of rectal bleeding that she states she had several months ago. She is also having dark stools.  ? ?HPI:   ?Stephanie [name] is a 66 y.o. female with past medical history of COPD, HTN, chronic Hep B, emphysema, constipation.  ? ?Patient presenting today as a new patient for rectal bleeding/melena. ? ?Patient states for the past few months she has had darker stools, sometimes black in color, this is now occurring everytime she has a BM. She reports she is having a BM maybe every other day or every two days. She does take senna to help with constipation. Had KUB done by PCP that showed a lot of stool in her colon, she was recommended to start metamucil but did not notice much results from this. Does endorse needing to strain to have a BM. Does not have any rectal pain. She has noticed 2 episodes of BRB dripping out of her rectum after having a BM. Denies any abdominal pain. Denies any weight loss or changes in her appetite. Denies nausea or vomiting. She does have issues with acid reflux, she reports especially if she eats later. Endorses this occurs maybe once per week, usually with acid regurgitation. She takes tums which seems to help her symptoms. Denies bloating or post prandial abdominal pain but does note some early satiety.  ? ?NSAID use: none ?Social hx: occasionally has a drink, no tobacco, stopped 11 years ago  ?Fam hx: no CRC ? ?Last Colonoscopy:October 2011-redundant colon, 64m polyp in sigmoid colon ?Last Endoscopy:never ? ? ?Past Medical History:  ?Diagnosis Date  ? COPD (chronic obstructive pulmonary disease) ([place]   ? quit smoking 8 yrs ago  ? COVID 27517 ? no complications per patient  ? Hypertension   ? Positive TB test many yrs ago  ? Right ovarian cyst   ? Sinusitis   ? occ   ? ? ?Past Surgical History:  ?Procedure Laterality Date  ? bronscopy  2000  ? CATARACT EXTRACTION W/PHACO Left 08/23/2021  ? Procedure: CATARACT EXTRACTION PHACO AND INTRAOCULAR LENS PLACEMENT (IOC);  Surgeon: WBaruch Goldmann MD;  Location: [facility];  Service: Ophthalmology;  Laterality: Left;  CDE 73.40  ? colonscopy  2013  ? HYSTEROSCOPY N/A 08/26/2019  ? Procedure: HYSTEROSCOPY;  Surgeon: MCheri Fowler MD;  [facility]  Service: [facility]cology;  Laterality: N/A;  ? LAPAROSCOPIC UNILATERAL SALPINGO OOPHERECTOMY Right 08/26/2019  ? Procedure: LAPAROSCOPIC BILATERAL SALPINGO OOPHORECTOMY;  Surgeon: MCheri Fowler MD;  [facility]  Service: Gynecology;  Laterality: Right;  ? RADIOACTIVE SEED GUIDED EXCISIONAL BREAST BIOPSY Left 10/11/2020  ? Procedure: LEFT BREAST RADIOACTIVE SEED GUIDED EXCISIONAL BREAST BIOPSY;  Surgeon: WRolm Bookbinder MD;  [place]  Service: General;  Laterality: Left;  ? ? ?Current Outpatient Medications  ?Medication Sig Dispense Refill  ? acetaminophen (TYLENOL) 650 MG CR tablet Take 650 mg by mouth every 8 (eight) hours as needed for pain.    ? Calcium Carb-Cholecalciferol (CALCIUM 600 + D PO) Take 1 tablet by mouth every evening.    ? Multiple Vitamin (MULTIVITAMIN WITH MINERALS) TABS tablet Take 1 tablet by mouth every evening.    ? senna (SENOKOT) 8.6 MG tablet Take 1 tablet by mouth daily as needed for constipation.    ? triamterene-hydrochlorothiazide (  MAXZIDE) 75-50 MG tablet Take 0.5 tablets by mouth every evening.    ? ?No current facility-administered medications for this visit.  ? ? ?Allergies as of 12/10/2021  ? (No Known Allergies)  ? ? ?Family History  ?Problem Relation Age of Onset  ? Breast cancer Sister   ? ? ?Social History  ? ?Socioeconomic History  ? Marital status: Widowed  ?  Spouse name: Not on file  ? Number of children: Not on file  ? Years of education: Not on file  ? Highest education  level: Not on file  ?Occupational History  ? Not on file  ?Tobacco Use  ? Smoking status: Former  ?  Packs/day: 1.00  ?  Years: 50.00  ?  Pack years: 50.00  ?  Types: Cigarettes  ? Smokeless tobacco: Never  ? Tobacco comments:  ?  quit 8 yrs ago  ?Vaping Use  ? Vaping Use: Never used  ?Substance and Sexual Activity  ? Alcohol use: Yes  ?  Comment: occ  ? Drug use: Never  ? Sexual activity: Not on file  ?Other Topics Concern  ? Not on file  ?Social History Narrative  ? Not on file  ? ?Social Determinants of Health  ? ?Financial Resource Strain: Not on file  ?Food Insecurity: Not on file  ?Transportation Needs: Not on file  ?Physical Activity: Not on file  ?Stress: Not on file  ?Social Connections: Not on file  ? ?Review of systems ?General: negative for malaise, night sweats, fever, chills, weight loss ?Neck: Negative for lumps, goiter, pain and significant neck swelling ?Resp: Negative for cough, wheezing, dyspnea at rest ?CV: Negative for chest pain, leg swelling, palpitations, orthopnea ?GI: denies  nausea, vomiting, diarrhea,  dysphagia, odyonophagia, or unintentional weight loss. +constipation +melena +BRBPR +early satiety ?MSK: Negative for joint pain or swelling, back pain, and muscle pain. ?Derm: Negative for itching or rash ?Psych: Denies depression, anxiety, memory loss, confusion. No homicidal or suicidal ideation.  ?Heme: Negative for prolonged bleeding, bruising easily, and swollen nodes. ?Endocrine: Negative for cold or heat intolerance, polyuria, polydipsia and goiter. ?Neuro: negative for tremor, gait imbalance, syncope and seizures. ?The remainder of the review of systems is noncontributory. ? ?Physical Exam: ?BP (!) 142/85 (BP Location: Left Arm, Patient Position: Sitting, Cuff Size: Small)   Pulse 92   Temp 98.1 ?F (36.7 ?C) (Oral)   Ht 5' (1.524 m)   Wt 113 lb 9.6 oz (51.5 kg)   BMI 22.19 kg/m?  ?General:   Alert and oriented. No distress noted. Pleasant and cooperative.  ?Head:   Normocephalic and atraumatic. ?Eyes:  Conjuctiva clear without scleral icterus. ?Mouth:  Oral mucosa pink and moist. Good dentition. No lesions. ?Heart: Normal rate and rhythm, s1 and s2 heart sounds present.  ?Lungs: Clear lung sounds in all lobes. Respirations equal and unlabored. ?Abdomen:  +BS, soft, non-tender and non-distended. No rebound or guarding. No HSM or masses noted. ?Derm: No palmar erythema or jaundice ?Msk:  Symmetrical without gross deformities. Normal posture. ?Extremities:  Without edema. ?Neurologic:  Alert and  oriented x4 ?Psych:  Alert and cooperative. Normal mood and affect. ? ?Invalid input(s): 6 MONTHS  ? ?ASSESSMENT: ?Stephanie [name] is a 66 y.o. female presenting today as a new patient for rectal bleeding/melena/constipation. ? ?Darker/black stools, now occurring almost everytime she has a BM, two episodes of BRBPR after having a BM. Does endorse some early satiety as well. No weight loss, changes in appetite, nausea, vomiting or dysphagia. Does not  drink or use NSAIDs. Has occasional reflux symptoms. Will get patient scheduled for EGD and colonoscopy for further evaluation of melena/rectal bleeding and early satiety as we cannot rule out AVMs, bleeding polyps, gastritis, or less likely malignancy. Indications, risks and benefits of procedure discussed in detail with patient. Patient verbalized understanding and is in agreement to proceed with EGD and Colonoscopy at this time.  ? ?Does endorse constipation with recent imaging showing stool throughout the colon. She is using senna for her constipation with some results. I will attempt to obtain these results for her chart as well as recent labs. I discussed continuing to drink plenty of water and eating diet high in fruits, veggies and whole grains. She can also start miralax with dosing instructions provided.  ? ?PLAN:  ?Schedule EGD and Colonoscopy ?2. Start taking Miralax 1 capful every day for one week. If bowel movements do not  improve, increase to 1 capful every 12 hours. If after two weeks there is no improvement, increase to 1 capful every 8 hours ?3. Obtain KUB results from PCP and labs ?4. Diet high in water, fruits, veggies and whole grains ?5.

## 2021-12-10 NOTE — Patient Instructions (Signed)
We will get you scheduled for EGD and colonoscopy for further evaluation of dark stools and rectal bleeding ?Please avoid greasy, spicy, tomato based, citrus foods, carbonated drinks, alcohol, chocolate and caffeine as these can worsen reflux symptoms ?Avoid eating late, stay upright 2-3 hours after eating, prior to lying down ?Start taking Miralax 1 capful every day for one week. If bowel movements do not improve, increase to 1 capful every 12 hours. If after two weeks there is no improvement, increase to 1 capful every 8 hours ?Make sure you are drinking plenty of water and eating diet high in fruits, veggies and whole grains.  ?

## 2021-12-17 ENCOUNTER — Other Ambulatory Visit (INDEPENDENT_AMBULATORY_CARE_PROVIDER_SITE_OTHER): Payer: Self-pay

## 2021-12-17 ENCOUNTER — Encounter (INDEPENDENT_AMBULATORY_CARE_PROVIDER_SITE_OTHER): Payer: Self-pay

## 2021-12-17 DIAGNOSIS — I10 Essential (primary) hypertension: Secondary | ICD-10-CM

## 2021-12-17 DIAGNOSIS — Z01812 Encounter for preprocedural laboratory examination: Secondary | ICD-10-CM

## 2022-01-01 ENCOUNTER — Other Ambulatory Visit (HOSPITAL_COMMUNITY)
Admission: RE | Admit: 2022-01-01 | Discharge: 2022-01-01 | Disposition: A | Discharge status: Home / Self Care | Payer: Medicare Other | Source: Ambulatory Visit | Attending: Gastroenterology | Admitting: Gastroenterology

## 2022-01-01 ENCOUNTER — Other Ambulatory Visit (INDEPENDENT_AMBULATORY_CARE_PROVIDER_SITE_OTHER): Payer: Self-pay | Admitting: Gastroenterology

## 2022-01-01 DIAGNOSIS — K625 Hemorrhage of anus and rectum: Secondary | ICD-10-CM | POA: Diagnosis present

## 2022-01-01 LAB — BASIC METABOLIC PANEL
Anion gap: 8 (ref 5–15)
BUN: 17 mg/dL (ref 8–23)
CO2: 30 mmol/L (ref 22–32)
Calcium: 9 mg/dL (ref 8.9–10.3)
Chloride: 99 mmol/L (ref 98–111)
Creatinine, Ser: 1.12 mg/dL — ABNORMAL HIGH (ref 0.44–1.00)
GFR, Estimated: 55 mL/min — ABNORMAL LOW (ref >60)
Glucose, Bld: 112 mg/dL — ABNORMAL HIGH (ref 70–99)
Potassium: 3 mmol/L — ABNORMAL LOW (ref 3.5–5.1)
Sodium: 137 mmol/L (ref 135–145)

## 2022-01-01 MED ORDER — POTASSIUM CHLORIDE CRYS ER 20 MEQ PO TBCR
40.0000 meq | EXTENDED_RELEASE_TABLET | Freq: Every day | ORAL | 0 refills | Status: DC
Start: 2022-01-01 — End: 2022-01-03

## 2022-01-03 ENCOUNTER — Ambulatory Visit (HOSPITAL_COMMUNITY): Payer: Medicare Other | Admitting: Anesthesiology

## 2022-01-03 ENCOUNTER — Encounter (HOSPITAL_COMMUNITY): Payer: Self-pay | Admitting: Gastroenterology

## 2022-01-03 ENCOUNTER — Ambulatory Visit (HOSPITAL_COMMUNITY)
Admission: RE | Admit: 2022-01-03 | Discharge: 2022-01-03 | Disposition: A | Discharge status: Home / Self Care | Payer: Medicare Other | Attending: Gastroenterology | Admitting: Gastroenterology

## 2022-01-03 ENCOUNTER — Other Ambulatory Visit: Payer: Self-pay

## 2022-01-03 ENCOUNTER — Encounter (HOSPITAL_COMMUNITY)
Admission: RE | Disposition: A | Discharge status: Home / Self Care | Payer: Self-pay | Source: Home / Self Care | Attending: Gastroenterology

## 2022-01-03 ENCOUNTER — Ambulatory Visit (HOSPITAL_BASED_OUTPATIENT_CLINIC_OR_DEPARTMENT_OTHER): Payer: Medicare Other | Admitting: Anesthesiology

## 2022-01-03 DIAGNOSIS — K319 Disease of stomach and duodenum, unspecified: Secondary | ICD-10-CM | POA: Insufficient documentation

## 2022-01-03 DIAGNOSIS — J439 Emphysema, unspecified: Secondary | ICD-10-CM | POA: Insufficient documentation

## 2022-01-03 DIAGNOSIS — K625 Hemorrhage of anus and rectum: Secondary | ICD-10-CM

## 2022-01-03 DIAGNOSIS — Z8616 Personal history of COVID-19: Secondary | ICD-10-CM | POA: Diagnosis not present

## 2022-01-03 DIAGNOSIS — Z79899 Other long term (current) drug therapy: Secondary | ICD-10-CM | POA: Insufficient documentation

## 2022-01-03 DIAGNOSIS — K648 Other hemorrhoids: Secondary | ICD-10-CM | POA: Insufficient documentation

## 2022-01-03 DIAGNOSIS — K922 Gastrointestinal hemorrhage, unspecified: Secondary | ICD-10-CM | POA: Diagnosis not present

## 2022-01-03 DIAGNOSIS — K5731 Diverticulosis of large intestine without perforation or abscess with bleeding: Secondary | ICD-10-CM | POA: Diagnosis not present

## 2022-01-03 DIAGNOSIS — K5730 Diverticulosis of large intestine without perforation or abscess without bleeding: Secondary | ICD-10-CM | POA: Diagnosis not present

## 2022-01-03 DIAGNOSIS — I10 Essential (primary) hypertension: Secondary | ICD-10-CM | POA: Insufficient documentation

## 2022-01-03 DIAGNOSIS — K921 Melena: Secondary | ICD-10-CM | POA: Insufficient documentation

## 2022-01-03 DIAGNOSIS — Z87891 Personal history of nicotine dependence: Secondary | ICD-10-CM | POA: Insufficient documentation

## 2022-01-03 HISTORY — PX: COLONOSCOPY WITH PROPOFOL: SHX5780

## 2022-01-03 HISTORY — PX: BIOPSY: SHX5522

## 2022-01-03 HISTORY — PX: ESOPHAGOGASTRODUODENOSCOPY (EGD) WITH PROPOFOL: SHX5813

## 2022-01-03 LAB — HM COLONOSCOPY

## 2022-01-03 SURGERY — COLONOSCOPY WITH PROPOFOL
Anesthesia: General

## 2022-01-03 MED ORDER — PROPOFOL 10 MG/ML IV BOLUS
INTRAVENOUS | Status: DC | PRN
  Administered 2022-01-03: 80 mg via INTRAVENOUS

## 2022-01-03 MED ORDER — SIMETHICONE 40 MG/0.6ML PO SUSP
ORAL | Status: DC | PRN
  Administered 2022-01-03: 100 mL

## 2022-01-03 MED ORDER — LACTATED RINGERS IV SOLN: INTRAVENOUS | Status: DC

## 2022-01-03 MED ORDER — OMEPRAZOLE 40 MG PO CPDR: 40.0000 mg | DELAYED_RELEASE_CAPSULE | Freq: Every day | ORAL | 3 refills | Status: DC

## 2022-01-03 MED ORDER — LIDOCAINE HCL (CARDIAC) PF 100 MG/5ML IV SOSY
PREFILLED_SYRINGE | INTRAVENOUS | Status: DC | PRN
  Administered 2022-01-03: 40 mg via INTRAVENOUS

## 2022-01-03 MED ORDER — PROPOFOL 500 MG/50ML IV EMUL
INTRAVENOUS | Status: DC | PRN
  Administered 2022-01-03: 150 ug/kg/min via INTRAVENOUS

## 2022-01-03 NOTE — Transfer of Care (Signed)
Immediate Anesthesia Transfer of Care Note ? ?Patient: GOLA BRIBIESCA ? ?Procedure(s) Performed: COLONOSCOPY WITH PROPOFOL ?ESOPHAGOGASTRODUODENOSCOPY (EGD) WITH PROPOFOL ?BIOPSY ? ?Patient Location: Endoscopy Unit ? ?Anesthesia Type:General ? ?Level of Consciousness: drowsy ? ?Airway & Oxygen Therapy: Patient Spontanous Breathing ? ?Post-op Assessment: Report given to RN and Post -op Vital signs reviewed and stable ? ?Post vital signs: Reviewed and stable ? ?Last Vitals:  ?Vitals Value Taken Time  ?BP    ?Temp    ?Pulse 70   ?Resp 13   ?SpO2 98%   ? ? ?Last Pain:  ?Vitals:  ? 01/03/22 0735  ?TempSrc:   ?PainSc: 0-No pain  ?   ? ?Patients Stated Pain Goal: 7 (01/03/22 6010) ? ?Complications: No notable events documented. ?

## 2022-01-03 NOTE — Discharge Instructions (Addendum)
You are being discharged to home.  ?Resume your previous diet.  ?We are waiting for your pathology results.  ?Take Prilosec (omeprazole) 40 mg by mouth once a day.  ?Do not take any ibuprofen (including Advil, Motrin or Nuprin), naproxen, or other non-steroidal anti-inflammatory drugs.  ?Your physician has recommended a repeat colonoscopy in 10 years for screening purposes.  ?

## 2022-01-03 NOTE — Op Note (Signed)
[facility] ?Patient Name: Stephanie [name] ?Procedure Date: 01/03/2022 7:06 AM ?MRN: [id number] ?Date of Birth: 07/05/1956 ?Attending MD: Maylon Peppers ,  ?CSN: [id number] ?Age: 66 ?Admit Type: Outpatient ?Procedure:                Colonoscopy ?Indications:              Rectal bleeding ?Providers:                Maylon Peppers, Charlsie Quest Theda Sers RN, RN, Kenney Houseman  ?                          [place] ?Referring MD:              ?Medicines:                Monitored Anesthesia Care ?Complications:            No immediate complications. ?Estimated Blood Loss:     Estimated blood loss: none. ?Procedure:                Pre-Anesthesia Assessment: ?                          - Prior to the procedure, a History and Physical  ?                          was performed, and patient medications, allergies  ?                          and sensitivities were reviewed. The patient's  ?                          tolerance of previous anesthesia was reviewed. ?                          - The risks and benefits of the procedure and the  ?                          sedation options and risks were discussed with the  ?                          patient. All questions were answered and informed  ?                          consent was obtained. ?                          - ASA Grade Assessment: II - A patient with mild  ?                          systemic disease. ?                          After obtaining informed consent, the colonoscope  ?                          was passed under direct vision. Throughout the  ?  procedure, the patient's blood pressure, pulse, and  ?                          oxygen saturations were monitored continuously. The  ?                          [id number]-[id number] ([id number]) was introduced through the  ?                          anus and advanced to the the terminal ileum. The  ?                          colonoscopy was performed without difficulty. The  ?                          patient tolerated  the procedure well. The quality  ?                          of the bowel preparation was excellent. ?Scope In: 7:50:47 AM ?Scope Out: 8:09:44 AM ?Scope Withdrawal Time: 0 hours 12 minutes 24 seconds  ?Total Procedure Duration: 0 hours 18 minutes 57 seconds  ?Findings: ?     The perianal and digital rectal examinations were normal. ?     The terminal ileum appeared normal. ?     Scattered medium-mouthed diverticula were found in the sigmoid colon. ?     Non-bleeding internal hemorrhoids were found during retroflexion. The  ?     hemorrhoids were small. ?Impression:               - The examined portion of the ileum was normal. ?                          - Diverticulosis in the sigmoid colon. ?                          - Non-bleeding internal hemorrhoids. ?                          - No specimens collected. ?Moderate Sedation: ?     Per Anesthesia Care ?Recommendation:           - Discharge patient to home (ambulatory). ?                          - Resume previous diet. ?                          - Repeat colonoscopy in 10 years for screening  ?                          purposes. ?Procedure Code(s):        --- Professional --- ?                          [phone], Colonoscopy, flexible; diagnostic, including  ?  collection of specimen(s) by brushing or washing,  ?                          when performed (separate procedure) ?Diagnosis Code(s):        --- Professional --- ?                          I01.6, Other hemorrhoids ?                          K62.5, Hemorrhage of anus and rectum ?                          K57.30, Diverticulosis of large intestine without  ?                          perforation or abscess without bleeding ?CPT copyright 2019 [employer]. All rights reserved. ?The codes documented in this report are preliminary and upon coder review may  ?be revised to meet current compliance requirements. ?Maylon Peppers, MD ?Maylon Peppers,  ?01/03/2022 8:15:19 AM ?This  report has been signed electronically. ?Number of Addenda: 0 ?

## 2022-01-03 NOTE — Op Note (Addendum)
[facility] ?Patient Name: Stephanie [name] ?Procedure Date: 01/03/2022 7:12 AM ?MRN: [id number] ?Date of Birth: [birth year] ?Attending MD: Maylon Peppers ,  ?CSN: [id number] ?Age: 66 ?Admit Type: Outpatient ?Procedure:                Upper GI endoscopy ?Indications:              Melena ?Providers:                Maylon Peppers, Charlsie Quest Theda Sers RN, RN, Kenney Houseman  ?                          [place] ?Referring MD:              ?Medicines:                Monitored Anesthesia Care ?Complications:            No immediate complications. ?Estimated Blood Loss:     Estimated blood loss: none. ?Procedure:                Pre-Anesthesia Assessment: ?                          - ASA Grade Assessment: II - A patient with mild  ?                          systemic disease. ?                          - Prior to the procedure, a History and Physical  ?                          was performed, and patient medications, allergies  ?                          and sensitivities were reviewed. The patient's  ?                          tolerance of previous anesthesia was reviewed. ?                          - The risks and benefits of the procedure and the  ?                          sedation options and risks were discussed with the  ?                          patient. All questions were answered and informed  ?                          consent was obtained. ?                          - ASA Grade Assessment: II - A patient with mild  ?                          systemic disease. ?  After obtaining informed consent, the endoscope was  ?                          passed under direct vision. Throughout the  ?                          procedure, the patient's blood pressure, pulse, and  ?                          oxygen saturations were monitored continuously. The  ?                          GIF-H190 ([id number]) scope was introduced through the  ?                          mouth, and advanced to the second part of duodenum.   ?                          The upper GI endoscopy was accomplished without  ?                          difficulty. The patient tolerated the procedure  ?                          well. ?Scope In: 7:38:12 AM ?Scope Out: 7:45:12 AM ?Total Procedure Duration: 0 hours 7 minutes 0 seconds  ?Findings: ?     The Z-line was regular and was found 35 cm from the incisors. ?     The examined esophagus was normal. ?     A few localized 2 to 5 mm erosions with stigmata of recent bleeding were  ?     found in the gastric body and in the gastric antrum. One had a eschar  ?     appearance. Biopsies were taken with a cold forceps for Helicobacter  ?     pylori testing - this was performed following [place] protocol. ?     The examined duodenum was normal. ?Impression:               - Z-line regular, 35 cm from the incisors. ?                          - Normal esophagus. ?                          - Erosive gastropathy with stigmata of recent  ?                          bleeding. Biopsied. ?                          - Normal examined duodenum. ?Moderate Sedation: ?     Per Anesthesia Care ?Recommendation:           - Discharge patient to home (ambulatory). ?                          - Resume previous diet. ?                          -  Await pathology results. ?                          - Use Prilosec (omeprazole) 40 mg PO daily. ?                          - No ibuprofen, naproxen, or other non-steroidal  ?                          anti-inflammatory drugs. ?Procedure Code(s):        --- Professional --- ?                          [phone], Esophagogastroduodenoscopy, flexible,  ?                          transoral; with biopsy, single or multiple ?Diagnosis Code(s):        --- Professional --- ?                          K92.2, Gastrointestinal hemorrhage, unspecified ?                          K92.1, Melena (includes Hematochezia) ?CPT copyright 2019 American Medical Association. All rights reserved. ?The codes documented in this report  are preliminary and upon coder review may  ?be revised to meet current compliance requirements. ?Maylon Peppers, MD ?Maylon Peppers,  ?01/03/2022 7:49:39 AM ?This report has been signed electronically. ?Number of Addenda: 0 ?

## 2022-01-03 NOTE — Interval H&P Note (Signed)
History and Physical Interval Note: ? ?01/03/2022 ?7:29 AM ? ?Stephanie [name]  has presented today for surgery, with the diagnosis of Rectal Bleeding Melena.  The various methods of treatment have been discussed with the patient and family. After consideration of risks, benefits and other options for treatment, the patient has consented to  Procedure(s) with comments: ?COLONOSCOPY WITH PROPOFOL (N/A) - 730 ?ESOPHAGOGASTRODUODENOSCOPY (EGD) WITH PROPOFOL (N/A) as a surgical intervention.  The patient's history has been reviewed, patient examined, no change in status, stable for surgery.  I have reviewed the patient's chart and labs.  Questions were answered to the patient's satisfaction.   ? ? ?Stephanie [name] ? ? ?

## 2022-01-03 NOTE — Anesthesia Preprocedure Evaluation (Addendum)
Anesthesia Evaluation  ?Patient identified by MRN, date of birth, ID band ?Patient awake ? ? ? ?Reviewed: ?Allergy & Precautions, NPO status , Patient's Chart, lab work & pertinent test results ? ?Airway ?Mallampati: II ? ?TM Distance: >3 FB ?Neck ROM: Full ? ? ? Dental ? ?(+) Dental Advisory Given, Partial Lower, Partial Upper ?  ?Pulmonary ?COPD, former smoker,  ?  ?Pulmonary exam normal ?breath sounds clear to auscultation ? ? ? ? ? ? Cardiovascular ?hypertension, Pt. on medications ?Normal cardiovascular exam ?Rhythm:Regular Rate:Normal ? ? ?  ?Neuro/Psych ?negative neurological ROS ? negative psych ROS  ? GI/Hepatic ?negative GI ROS, Neg liver ROS,   ?Endo/Other  ?negative endocrine ROS ? Renal/GU ?negative Renal ROS  ?negative genitourinary ?  ?Musculoskeletal ?negative musculoskeletal ROS ?(+)  ? Abdominal ?  ?Peds ?negative pediatric ROS ?(+)  Hematology ?negative hematology ROS ?(+)   ?Anesthesia Other Findings ? ? Reproductive/Obstetrics ?negative OB ROS ? ?  ? ? ? ? ? ? ? ? ? ? ? ? ? ?  ?  ? ? ? ? ? ? ? ?Anesthesia Physical ?Anesthesia Plan ? ?ASA: 2 ? ?Anesthesia Plan: General  ? ?Post-op Pain Management: Minimal or no pain anticipated  ? ?Induction: Intravenous ? ?PONV Risk Score and Plan: Propofol infusion ? ?Airway Management Planned: Nasal Cannula and Natural Airway ? ?Additional Equipment:  ? ?Intra-op Plan:  ? ?Post-operative Plan:  ? ?Informed Consent: I have reviewed the patients History and Physical, chart, labs and discussed the procedure including the risks, benefits and alternatives for the proposed anesthesia with the patient or authorized representative who has indicated his/her understanding and acceptance.  ? ? ? ?Dental advisory given ? ?Plan Discussed with: CRNA and Surgeon ? ?Anesthesia Plan Comments:   ? ? ? ? ? ? ?Anesthesia Quick Evaluation ? ?

## 2022-01-03 NOTE — Anesthesia Postprocedure Evaluation (Signed)
Anesthesia Post Note ? ?Patient: WILLMA OBANDO ? ?Procedure(s) Performed: COLONOSCOPY WITH PROPOFOL ?ESOPHAGOGASTRODUODENOSCOPY (EGD) WITH PROPOFOL ?BIOPSY ? ?Patient location during evaluation: Phase II ?Anesthesia Type: General ?Level of consciousness: awake and alert and oriented ?Pain management: pain level controlled ?Vital Signs Assessment: post-procedure vital signs reviewed and stable ?Respiratory status: spontaneous breathing, nonlabored ventilation and respiratory function stable ?Cardiovascular status: blood pressure returned to baseline and stable ?Postop Assessment: no apparent nausea or vomiting ?Anesthetic complications: no ? ? ?No notable events documented. ? ? ?Last Vitals:  ?Vitals:  ? 01/03/22 0649 01/03/22 0814  ?BP: 135/78 95/64  ?Pulse:  76  ?Resp: 14 19  ?Temp: 36.7 ?C 36.4 ?C  ?SpO2: 97% 99%  ?  ?Last Pain:  ?Vitals:  ? 01/03/22 0814  ?TempSrc: Oral  ?PainSc: 0-No pain  ? ? ?  ?  ?  ?  ?  ?  ? ?Xochilth Standish C Mirabelle Cyphers ? ? ? ? ?

## 2022-01-06 ENCOUNTER — Encounter (INDEPENDENT_AMBULATORY_CARE_PROVIDER_SITE_OTHER): Payer: Self-pay | Admitting: *Deleted

## 2022-01-06 LAB — SURGICAL PATHOLOGY

## 2022-01-07 ENCOUNTER — Encounter (HOSPITAL_COMMUNITY): Payer: Self-pay | Admitting: Gastroenterology

## 2022-01-14 ENCOUNTER — Encounter (HOSPITAL_COMMUNITY): Payer: Self-pay

## 2022-04-10 ENCOUNTER — Encounter (INDEPENDENT_AMBULATORY_CARE_PROVIDER_SITE_OTHER): Payer: Self-pay | Admitting: Gastroenterology

## 2022-04-10 ENCOUNTER — Ambulatory Visit (INDEPENDENT_AMBULATORY_CARE_PROVIDER_SITE_OTHER): Payer: Medicare Other | Admitting: Gastroenterology

## 2022-04-10 VITALS — BP 128/81 | HR 80 | Temp 98.1°F | Ht 60.0 in | Wt 112.7 lb

## 2022-04-10 DIAGNOSIS — K3189 Other diseases of stomach and duodenum: Secondary | ICD-10-CM

## 2022-04-10 DIAGNOSIS — K219 Gastro-esophageal reflux disease without esophagitis: Secondary | ICD-10-CM | POA: Diagnosis not present

## 2022-04-10 NOTE — Patient Instructions (Signed)
I am glad you are feeling better  Please take Omeprazole '40mg'$  about 30 minutes prior to breakfast Avoid greasy, spicy, fried, citrus foods, and be mindful that caffeine, carbonated drinks, chocolate and alcohol can increase reflux symptoms Stay upright 2-3 hours after eating, prior to lying down and avoid eating late in the evenings.   Follow up 1 year

## 2022-04-10 NOTE — Progress Notes (Signed)
Referring Provider: Merrilee Seashore, MD Primary Care Physician:  Merrilee Seashore, MD Primary GI Physician: Jenetta Downer  Chief Complaint  Patient presents with   Gastroesophageal Reflux    Follow up on GERD and rectal bleeding. States she is doing well and has not had any more rectal bleeding. Reflux doing well. Pays out of pocket for omeprazole.    HPI:   RAINEE SWEATT is a 66 y.o. female with past medical history of COPD, HTN, chronic Hep B, emphysema, constipation.    Patient presenting today for follow up of rectal bleeding/melena.  Last seen December 10 2021, at that time reported darker, black stools and constipation. Also with some occasion BRBPR. Scheduled for EGD/colonoscopy for further evaluation as outliend below. Advised to start miralax, diet high in water and fiber and advised to practice reflux precautions. Started on omeprazole '40mg'$  once daily after EGD.  Present:  Doing better today, having a BM every other day. Denies constipation or diarrhea. She reports that omeprazole seems to have helped her GERD symptoms quite a bit. She has some occasional heartburn, usually only if she eats late and lays down, this occurs maybe a few times per month. She is taking omeprazole in the evenings. No red flag symptoms. Patient denies melena, hematochezia, nausea, vomiting, diarrhea, constipation, dysphagia, odyonophagia, early satiety or weight loss.   Last Colonoscopy:01/03/22 The examined portion of the ileum was normal. - Diverticulosis in the sigmoid colon. - Non-bleeding internal hemorrhoids. - No specimens collected. Last Endoscopy:4/14/23Z-line regular, 35 cm from the incisors. - Normal esophagus. - Erosive gastropathy with stigmata of recent bleeding. Biopsied.-normal  Normal examined duodenum  Recommendations:  Repeat in 10 years  Past Medical History:  Diagnosis Date   COPD (chronic obstructive pulmonary disease) ([place])    quit smoking 8 yrs ago   COVID 1610    no complications per patient   Hypertension    Positive TB test many yrs ago   Right ovarian cyst    Sinusitis    occ    Past Surgical History:  Procedure Laterality Date   BIOPSY  01/03/2022   Procedure: BIOPSY;  Surgeon: Harvel Quale, MD;  [facility]: [facility];  Service: Gastroenterology;;   bronscopy  2000   CATARACT EXTRACTION W/PHACO Left 08/23/2021   Procedure: CATARACT EXTRACTION PHACO AND INTRAOCULAR LENS PLACEMENT ([place]);  Surgeon: Baruch Goldmann, MD;  Location: [facility];  Service: Ophthalmology;  Laterality: Left;  CDE 73.40   COLONOSCOPY WITH PROPOFOL N/A 01/03/2022   Procedure: COLONOSCOPY WITH PROPOFOL;  Surgeon: Harvel Quale, MD;  Location: [facility];  Service: Gastroenterology;  Laterality: N/A;  730   colonscopy  2013   ESOPHAGOGASTRODUODENOSCOPY (EGD) WITH PROPOFOL N/A 01/03/2022   Procedure: ESOPHAGOGASTRODUODENOSCOPY (EGD) WITH PROPOFOL;  Surgeon: Harvel Quale, MD;  Location: [facility];  Service: Gastroenterology;  Laterality: N/A;   HYSTEROSCOPY N/A 08/26/2019   Procedure: HYSTEROSCOPY;  Surgeon: Cheri Fowler, MD;  Location: [facility];  Service: Gynecology;  Laterality: N/A;   LAPAROSCOPIC UNILATERAL SALPINGO OOPHERECTOMY Right 08/26/2019   Procedure: LAPAROSCOPIC BILATERAL SALPINGO OOPHORECTOMY;  Surgeon: Cheri Fowler, MD;  Location: [place];  Service: Gynecology;  Laterality: Right;   RADIOACTIVE SEED GUIDED EXCISIONAL BREAST BIOPSY Left 10/11/2020   Procedure: LEFT BREAST RADIOACTIVE SEED GUIDED EXCISIONAL BREAST BIOPSY;  Surgeon: Rolm Bookbinder, MD;  Location: [place];  Service: General;  Laterality: Left;    Current Outpatient Medications  Medication Sig Dispense Refill   acetaminophen (TYLENOL) 325  MG tablet Take 650 mg by mouth every 6 (six) hours as needed for mild pain or moderate pain.     Multiple Vitamin (MULTIVITAMIN WITH MINERALS) TABS tablet Take 1  tablet by mouth every evening.     omeprazole (PRILOSEC) 40 MG capsule Take 1 capsule (40 mg total) by mouth daily. 90 capsule 3   senna (SENOKOT) 8.6 MG tablet Take 1 tablet by mouth daily as needed for constipation.     triamterene-hydrochlorothiazide (MAXZIDE) 75-50 MG tablet Take 0.5 tablets by mouth every evening.     No current facility-administered medications for this visit.    Allergies as of 04/10/2022   (No Known Allergies)    Family History  Problem Relation Age of Onset   Breast cancer Sister     Social History   Socioeconomic History   Marital status: Widowed    Spouse name: Not on file   Number of children: Not on file   Years of education: Not on file   Highest education level: Not on file  Occupational History   Not on file  Tobacco Use   Smoking status: Former    Packs/day: 1.00    Years: 50.00    Total pack years: 50.00    Types: Cigarettes    Passive exposure: Never   Smokeless tobacco: Never   Tobacco comments:    quit 8 yrs ago  Vaping Use   Vaping Use: Never used  Substance and Sexual Activity   Alcohol use: Yes    Comment: occ   Drug use: Never   Sexual activity: Not on file  Other Topics Concern   Not on file  Social History Narrative   Not on file   Social Determinants of Health   Financial Resource Strain: Not on file  Food Insecurity: Not on file  Transportation Needs: Not on file  Physical Activity: Not on file  Stress: Not on file  Social Connections: Not on file   Review of systems General: negative for malaise, night sweats, fever, chills, weight loss Neck: Negative for lumps, goiter, pain and significant neck swelling Resp: Negative for cough, wheezing, dyspnea at rest CV: Negative for chest pain, leg swelling, palpitations, orthopnea GI: denies melena, hematochezia, nausea, vomiting, diarrhea, constipation, dysphagia, odyonophagia, early satiety or unintentional weight loss. +occasional heartburn MSK: Negative for joint  pain or swelling, back pain, and muscle pain. Derm: Negative for itching or rash Psych: Denies depression, anxiety, memory loss, confusion. No homicidal or suicidal ideation.  Heme: Negative for prolonged bleeding, bruising easily, and swollen nodes. Endocrine: Negative for cold or heat intolerance, polyuria, polydipsia and goiter. Neuro: negative for tremor, gait imbalance, syncope and seizures. The remainder of the review of systems is noncontributory.  Physical Exam: BP 128/81 (BP Location: Left Arm, Patient Position: Sitting, Cuff Size: Normal)   Pulse 80   Temp 98.1 F (36.7 C) (Oral)   Ht 5' (1.524 m)   Wt 112 lb 11.2 oz (51.1 kg)   BMI 22.01 kg/m  General:   Alert and oriented. No distress noted. Pleasant and cooperative.  Head:  Normocephalic and atraumatic. Eyes:  Conjuctiva clear without scleral icterus. Mouth:  Oral mucosa pink and moist. Good dentition. No lesions. Heart: Normal rate and rhythm, s1 and s2 heart sounds present.  Lungs: Clear lung sounds in all lobes. Respirations equal and unlabored. Abdomen:  +BS, soft, non-tender and non-distended. No rebound or guarding. No HSM or masses noted. Derm: No palmar erythema or jaundice Msk:  Symmetrical without gross  deformities. Normal posture. Extremities:  Without edema. Neurologic:  Alert and  oriented x4 Psych:  Alert and cooperative. Normal mood and affect.  Invalid input(s): "6 MONTHS"   ASSESSMENT: NAZIAH PORTEE is a 66 y.o. female presenting today for follow up of rectal bleeding/melena.  Patient doing well today. No further rectal bleeding or melena. EGD in April with erosive gastritis, she was started on Omeprazole '40mg'$  daily and feels that this has really helped her symptoms. She is taking it around dinner time, and having occasional heartburn maybe 2 times per month.  we discussed trying it in the morning 30 minutes prior to eating, she should also avoid eating late and try to stay upright 2-3 hours after  eating prior to lying down. She should continue with other reflux precautions and avoid NSAIDs.   PLAN:  Continue omeprazole '40mg'$  daily, take in the mornings before eating 2. Continue reflux precautions   All questions were answered, patient verbalized understanding and is in agreement with plan as outlined above.   Follow Up: 1 year  Wendal Wilkie L. Alver Sorrow, MSN, APRN, AGNP-C Adult-Gerontology Nurse Practitioner [facility]

## 2022-04-17 ENCOUNTER — Ambulatory Visit (INDEPENDENT_AMBULATORY_CARE_PROVIDER_SITE_OTHER): Payer: Medicare Other | Admitting: Gastroenterology

## 2022-04-21 ENCOUNTER — Telehealth (INDEPENDENT_AMBULATORY_CARE_PROVIDER_SITE_OTHER): Payer: Self-pay

## 2022-04-21 NOTE — Telephone Encounter (Signed)
Stephanie Rung, NP  Karle Barr, CMA; Carmelina Noun, LPN Can we see if patient is amenable to having these labs done for further evaluation of her Hepatitis B?        Previous Messages    ----- Message -----  From: Harvel Quale, MD  Sent: 04/11/2022  11:29 PM EDT  To: Stephanie Rung, NP   Hi,  Can we check CMP, hepatitis B surface Ag, e antibody/antigen, Hep B viral load DNA , B c total Ab, surface antibody and HIV. We have never looked into this hepatitis B. I'll add that to the note.  Thanks

## 2022-04-22 NOTE — Telephone Encounter (Signed)
I spoke with the patient and she says she wants to discuss the need for further testing with her pcp, Dr. Rada Hay. Her appointment with him is in September.

## 2022-07-10 ENCOUNTER — Other Ambulatory Visit: Payer: Self-pay | Admitting: Internal Medicine

## 2022-07-10 DIAGNOSIS — Z1231 Encounter for screening mammogram for malignant neoplasm of breast: Secondary | ICD-10-CM

## 2022-09-06 ENCOUNTER — Ambulatory Visit
Admission: EM | Admit: 2022-09-06 | Discharge: 2022-09-06 | Disposition: A | Discharge status: Home / Self Care | Payer: Medicare Other

## 2022-09-06 ENCOUNTER — Other Ambulatory Visit: Payer: Self-pay

## 2022-09-06 DIAGNOSIS — K219 Gastro-esophageal reflux disease without esophagitis: Secondary | ICD-10-CM | POA: Diagnosis not present

## 2022-09-06 DIAGNOSIS — R1013 Epigastric pain: Secondary | ICD-10-CM

## 2022-09-06 MED ORDER — SUCRALFATE 1 G PO TABS: 1.0000 g | ORAL_TABLET | Freq: Three times a day (TID) | ORAL | 0 refills | Status: DC | PRN

## 2022-09-06 NOTE — Discharge Instructions (Signed)
Continue taking your Prilosec daily, I have also sent in a medication called sucralfate to help soothe your upper GI tract prior to meals to see if this helps with your symptoms.  We hope to have your lab results available tomorrow and someone will call you with any abnormalities.  Follow-up with your primary care provider in the next week or so for a recheck and go to the emergency department if your symptoms severely worsen

## 2022-09-06 NOTE — ED Triage Notes (Signed)
Pt reports this morning she had some pain in the middle of her chest and it went up. She says last couple of week it has been feeling like something is "compressed" on her chest.  She thought it was GERD. She is now taking omeprazole.. Pt also has copd

## 2022-09-06 NOTE — ED Provider Notes (Signed)
[facility]    CSN: [id number] Arrival date & time: 09/06/22  1054      History   Chief Complaint Chief Complaint  Patient presents with   Chest Pain    HPI JEFF MCCALLUM is a 66 y.o. female.   Patient presenting today today with 2-week history of on and off epigastric pain, pressure to the middle of her chest.  States symptoms mainly happen while she is sleeping and laying flat.  She denies shortness of breath, wheezing, chest tightness, palpitations, dizziness, nausea, vomiting, fevers, chills.  She initially thought it was GERD, started taking Meprazole and has been seen by GI for this which was initially helping but since morning her symptoms returned.    Past Medical History:  Diagnosis Date   COPD (chronic obstructive pulmonary disease) ([place])    quit smoking 8 yrs ago   COVID 0865   no complications per patient   Hypertension    Positive TB test many yrs ago   Right ovarian cyst    Sinusitis    occ    Patient Active Problem List   Diagnosis Date Noted   Erosive gastropathy 04/10/2022   Gastroesophageal reflux disease without esophagitis 04/10/2022   Rectal bleeding 12/10/2021   Melena 12/10/2021   Constipation 12/10/2021    Past Surgical History:  Procedure Laterality Date   BIOPSY  01/03/2022   Procedure: BIOPSY;  Surgeon: Harvel Quale, MD;  [facility];  Service: Gastroenterology;;   bronscopy  2000   CATARACT EXTRACTION W/PHACO Left 08/23/2021   Procedure: CATARACT EXTRACTION PHACO AND INTRAOCULAR LENS PLACEMENT ([place]);  Surgeon: Baruch Goldmann, MD;  Location: [facility];  Service: Ophthalmology;  Laterality: Left;  CDE 73.40   COLONOSCOPY WITH PROPOFOL N/A 01/03/2022   Procedure: COLONOSCOPY WITH PROPOFOL;  Surgeon: Harvel Quale, MD;  [facility];  Service: Gastroenterology;  Laterality: N/A;  730   colonscopy  2013   ESOPHAGOGASTRODUODENOSCOPY (EGD) WITH PROPOFOL N/A 01/03/2022   Procedure:  ESOPHAGOGASTRODUODENOSCOPY (EGD) WITH PROPOFOL;  Surgeon: Harvel Quale, MD;  [facility];  Service: Gastroenterology;  Laterality: N/A;   HYSTEROSCOPY N/A 08/26/2019   Procedure: HYSTEROSCOPY;  Surgeon: Cheri Fowler, MD;  Location: [facility];  Service: Gynecology;  Laterality: N/A;   LAPAROSCOPIC UNILATERAL SALPINGO OOPHERECTOMY Right 08/26/2019   Procedure: LAPAROSCOPIC BILATERAL SALPINGO OOPHORECTOMY;  Surgeon: Cheri Fowler, MD;  Location: [place];  Service: Gynecology;  Laterality: Right;   RADIOACTIVE SEED GUIDED EXCISIONAL BREAST BIOPSY Left 10/11/2020   Procedure: LEFT BREAST RADIOACTIVE SEED GUIDED EXCISIONAL BREAST BIOPSY;  Surgeon: Rolm Bookbinder, MD;  Location: [place];  Service: General;  Laterality: Left;    OB History   No obstetric history on file.      Home Medications    Prior to Admission medications   Medication Sig Start Date End Date Taking? Authorizing Provider  potassium chloride (KLOR-CON) 10 MEQ tablet Take 20 mEq by mouth daily. 08/08/22  Yes [provider]  sucralfate (CARAFATE) 1 g tablet Take 1 tablet (1 g total) by mouth 3 (three) times daily as needed. Dissolve 1 tablet into a glass of water and drink up to 3 times daily as needed 09/06/22  Yes Orene Desanctis, Lilia Argue, PA-C  acetaminophen (TYLENOL) 325 MG tablet Take 650 mg by mouth every 6 (six) hours as needed for mild pain or moderate pain.    [provider]  Multiple Vitamin (MULTIVITAMIN WITH MINERALS) TABS tablet Take  1 tablet by mouth every evening.    [provider]  omeprazole (PRILOSEC) 40 MG capsule Take 1 capsule (40 mg total) by mouth daily. 01/03/22   Harvel Quale, MD  senna (SENOKOT) 8.6 MG tablet Take 1 tablet by mouth daily as needed for constipation.    [provider]  triamterene-hydrochlorothiazide (MAXZIDE) 75-50 MG tablet Take 0.5 tablets by mouth every  evening.    [provider]    Family History Family History  Problem Relation Age of Onset   Breast cancer Sister     Social History Social History   Tobacco Use   Smoking status: Former    Packs/day: 1.00    Years: 50.00    Total pack years: 50.00    Types: Cigarettes    Passive exposure: Never   Smokeless tobacco: Never   Tobacco comments:    quit 8 yrs ago  Vaping Use   Vaping Use: Never used  Substance Use Topics   Alcohol use: Yes    Comment: occ   Drug use: Never     Allergies   Patient has no known allergies.   Review of Systems Review of Systems Per HPI  Physical Exam Triage Vital Signs ED Triage Vitals  Enc Vitals Group     BP 09/06/22 1105 (!) 165/100     Pulse Rate 09/06/22 1105 78     Resp 09/06/22 1105 20     Temp 09/06/22 1105 98.3 F (36.8 C)     Temp Source 09/06/22 1105 Oral     SpO2 09/06/22 1105 97 %     Weight --      Height --      Head Circumference --      Peak Flow --      Pain Score 09/06/22 1242 0     Pain Loc --      Pain Edu? --      Excl. in [place]? --    No data found.  Updated Vital Signs BP (!) 165/100 (BP Location: Right Arm)   Pulse 78   Temp 98.3 F (36.8 C) (Oral)   Resp 20   SpO2 97%   Visual Acuity Right Eye Distance:   Left Eye Distance:   Bilateral Distance:    Right Eye Near:   Left Eye Near:    Bilateral Near:     Physical Exam Vitals and nursing note reviewed.  Constitutional:      Appearance: Normal appearance. She is not ill-appearing.  HENT:     Head: Atraumatic.     Mouth/Throat:     Mouth: Mucous membranes are moist.  Eyes:     Extraocular Movements: Extraocular movements intact.     Conjunctiva/sclera: Conjunctivae normal.  Cardiovascular:     Rate and Rhythm: Normal rate and regular rhythm.     Heart sounds: Normal heart sounds.  Pulmonary:     Effort: Pulmonary effort is normal.     Breath sounds: Normal breath sounds. No wheezing or rales.  Chest:     Chest wall:  No tenderness.  Abdominal:     General: Bowel sounds are normal. There is no distension.     Palpations: Abdomen is soft.     Tenderness: There is no abdominal tenderness. There is no guarding.  Musculoskeletal:        General: Normal range of motion.     Cervical back: Normal range of motion and neck supple.  Skin:    General: Skin is  warm and dry.  Neurological:     Mental Status: She is alert and oriented to person, place, and time.  Psychiatric:        Mood and Affect: Mood normal.        Thought Content: Thought content normal.        Judgment: Judgment normal.      UC Treatments / Results  Labs (all labs ordered are listed, but only abnormal results are displayed) Labs Reviewed  COMPREHENSIVE METABOLIC PANEL  LIPASE    EKG   Radiology No results found.  Procedures Procedures (including critical care time)  Medications Ordered in UC Medications - No data to display  Initial Impression / Assessment and Plan / UC Course  I have reviewed the triage vital signs and the nursing notes.  Pertinent labs & imaging results that were available during my care of the patient were reviewed by me and considered in my medical decision making (see chart for details).     Hypertensive in triage, otherwise vital signs benign and reassuring.  Her EKG today is showing normal sinus rhythm at 78 bpm without acute ST or T wave changes and her exam is very reassuring.  Do suspect some gastritis/reflux type symptoms so continue Prilosec, add Carafate and discussed dietary changes, propping up when sleeping.  Follow-up with PCP for recheck and go to emergency department if symptoms worsen.  Final Clinical Impressions(s) / UC Diagnoses   Final diagnoses:  Abdominal pain, epigastric  Gastroesophageal reflux disease, unspecified whether esophagitis present     Discharge Instructions      Continue taking your Prilosec daily, I have also sent in a medication called sucralfate to help  soothe your upper GI tract prior to meals to see if this helps with your symptoms.  We hope to have your lab results available tomorrow and someone will call you with any abnormalities.  Follow-up with your primary care provider in the next week or so for a recheck and go to the emergency department if your symptoms severely worsen    ED Prescriptions     Medication Sig Dispense Auth. Provider   sucralfate (CARAFATE) 1 g tablet Take 1 tablet (1 g total) by mouth 3 (three) times daily as needed. Dissolve 1 tablet into a glass of water and drink up to 3 times daily as needed 90 tablet Volney American, [place]      PDMP not reviewed this encounter.   Volney American, [place] 09/06/22 1359

## 2022-09-07 LAB — COMPREHENSIVE METABOLIC PANEL
ALT: 19 IU/L (ref 0–32)
AST: 27 IU/L (ref 0–40)
Albumin/Globulin Ratio: 1.9 (ref 1.2–2.2)
Albumin: 4.8 g/dL (ref 3.9–4.9)
Alkaline Phosphatase: 86 IU/L (ref 44–121)
BUN/Creatinine Ratio: 25 (ref 12–28)
BUN: 19 mg/dL (ref 8–27)
Bilirubin Total: 0.4 mg/dL (ref 0.0–1.2)
CO2: 23 mmol/L (ref 20–29)
Calcium: 9.4 mg/dL (ref 8.7–10.3)
Chloride: 97 mmol/L (ref 96–106)
Creatinine, Ser: 0.76 mg/dL (ref 0.57–1.00)
Globulin, Total: 2.5 g/dL (ref 1.5–4.5)
Glucose: 98 mg/dL (ref 70–99)
Potassium: 3.7 mmol/L (ref 3.5–5.2)
Sodium: 138 mmol/L (ref 134–144)
Total Protein: 7.3 g/dL (ref 6.0–8.5)
eGFR: 86 mL/min/{1.73_m2} (ref >59)

## 2022-09-07 LAB — LIPASE: Lipase: 48 U/L (ref 14–72)

## 2022-10-02 ENCOUNTER — Ambulatory Visit
Admission: RE | Admit: 2022-10-02 | Discharge: 2022-10-02 | Disposition: A | Discharge status: Home / Self Care | Payer: Medicare Other | Source: Ambulatory Visit | Attending: Internal Medicine | Admitting: Internal Medicine

## 2022-10-02 DIAGNOSIS — Z1231 Encounter for screening mammogram for malignant neoplasm of breast: Secondary | ICD-10-CM

## 2023-01-14 IMAGING — MG MM DIGITAL DIAGNOSTIC UNILAT*L* W/ TOMO W/ CAD
4 series · 4 of 12 positions shown · non-contrast
Comparison: Previous exam(s).

CLINICAL DATA: Patient presents for diffuse left breast tenderness.

EXAM:
DIGITAL DIAGNOSTIC UNILATERAL LEFT MAMMOGRAM WITH TOMOSYNTHESIS AND
CAD
TECHNIQUE: Left digital diagnostic mammography and breast tomosynthesis was
performed. The images were evaluated with computer-aided detection.

[L MLO synth-2D]
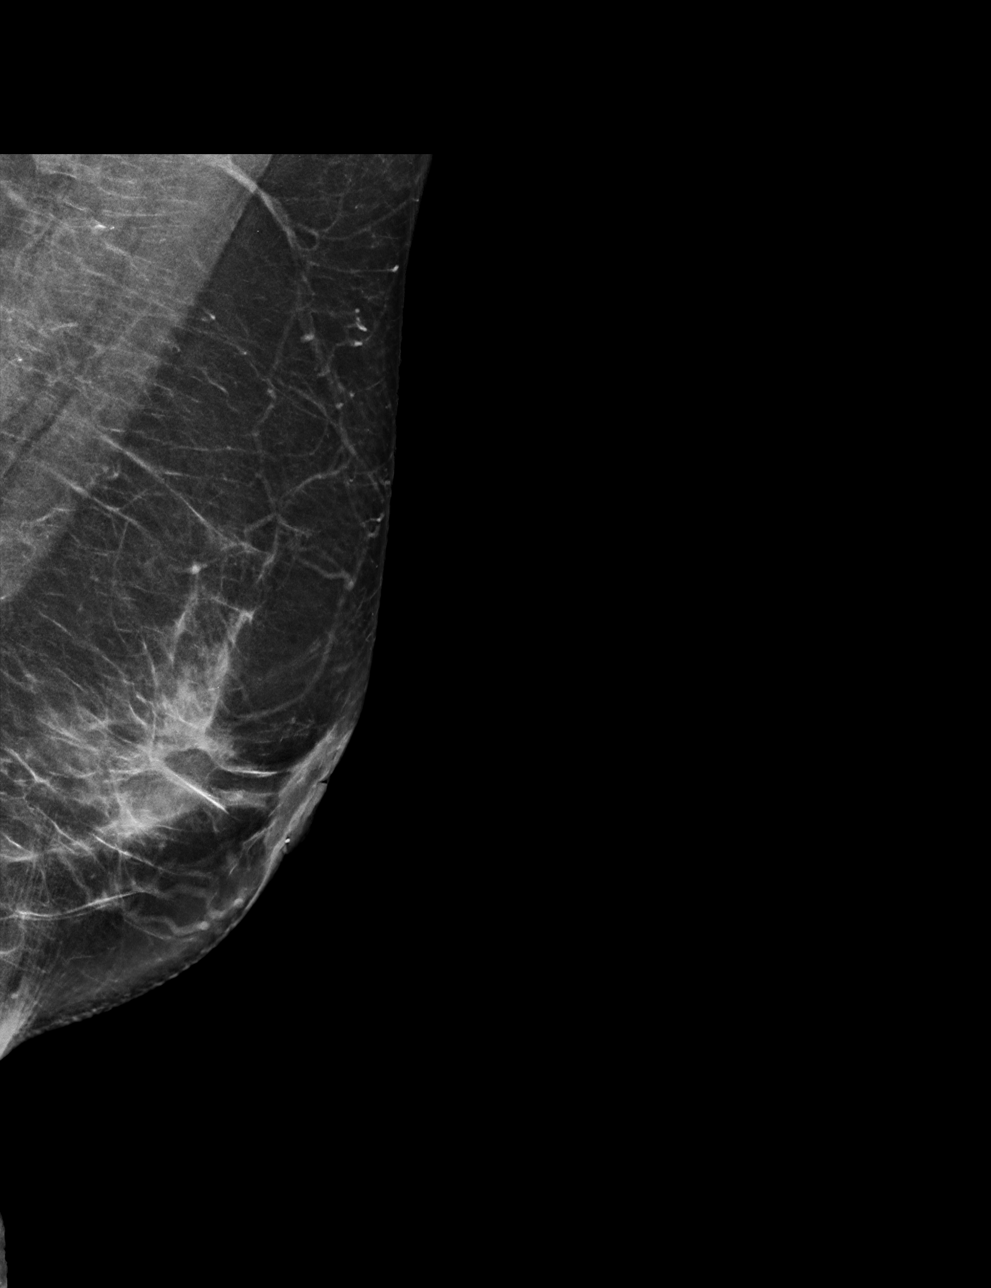

[L CC synth-2D]
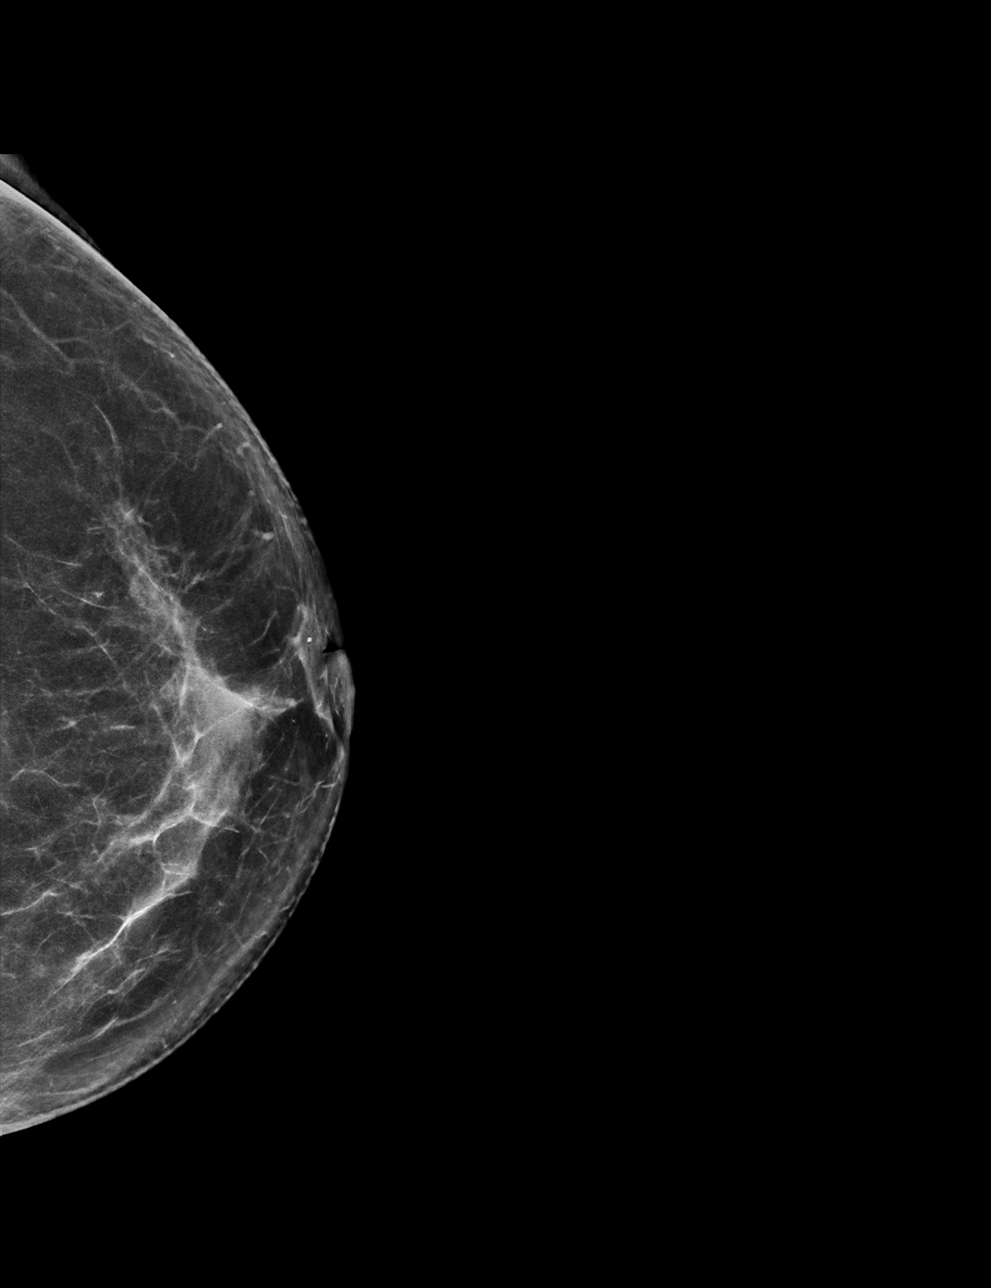

[L CC tomo · tomo slice 35/69.0]
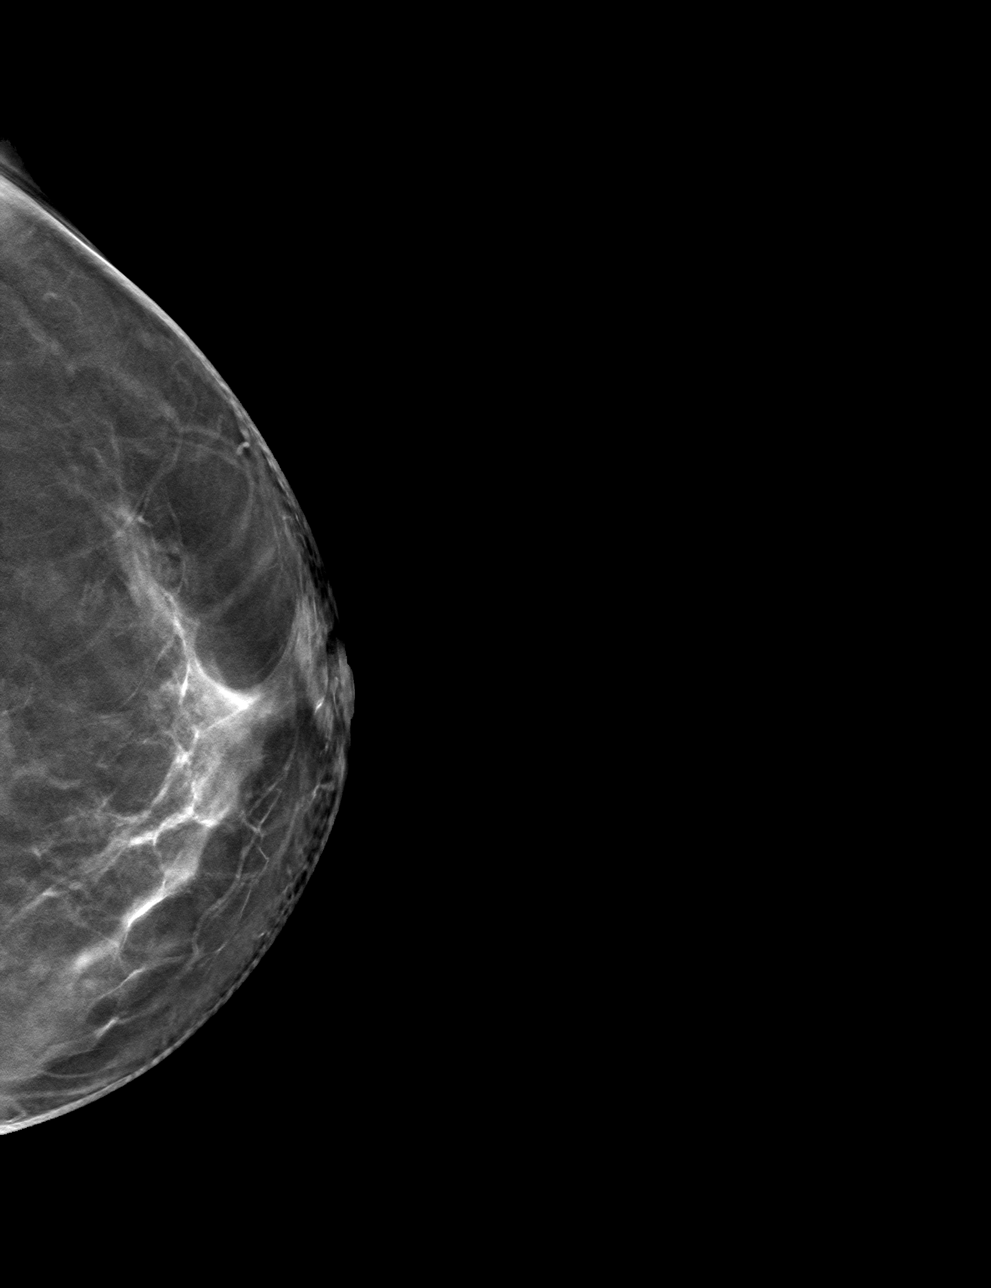

[L MLO tomo · tomo slice 35/68.0]
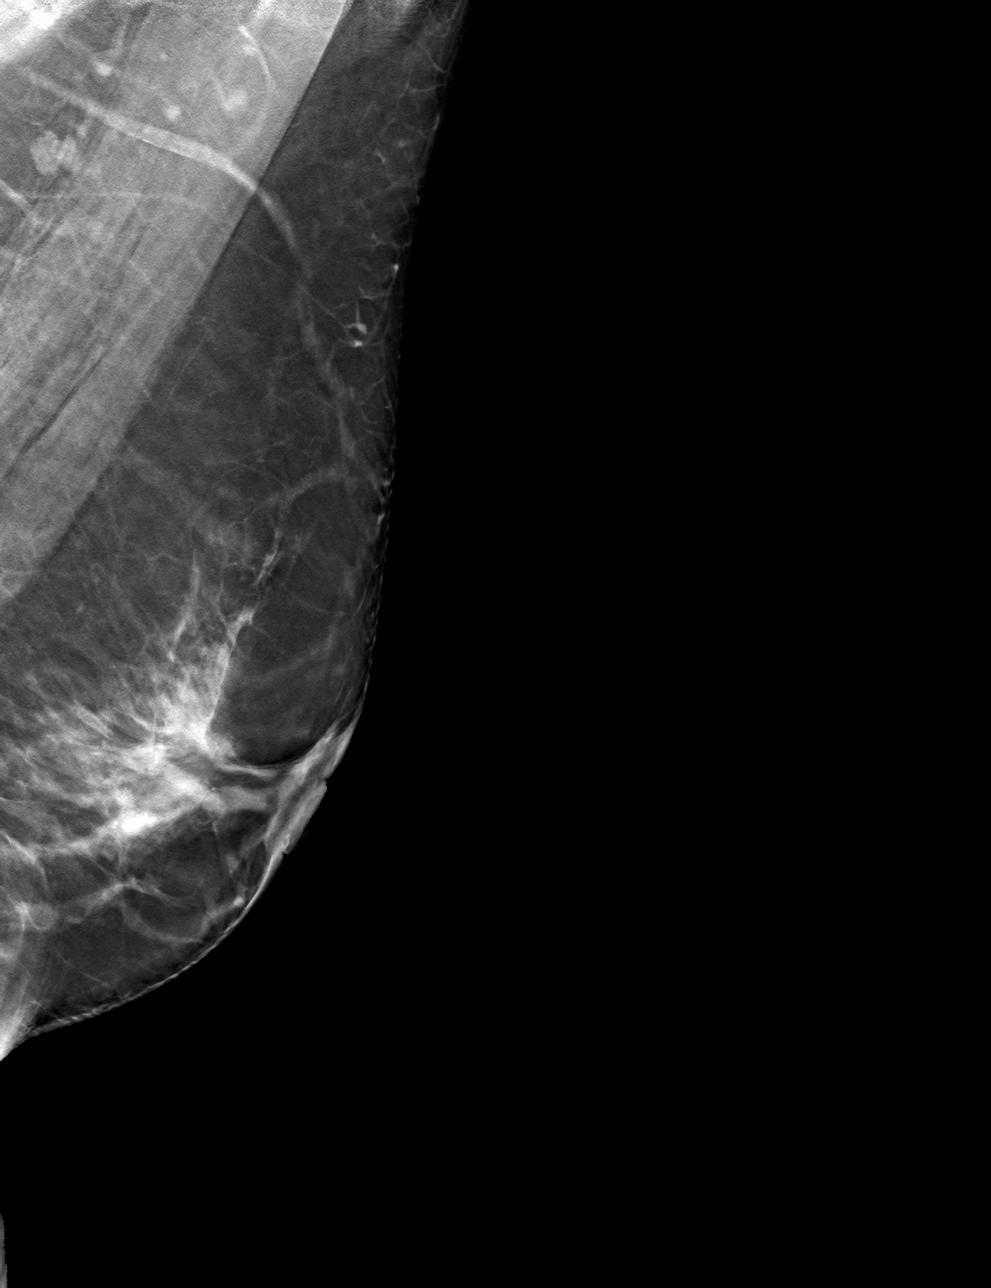

[4 of 12 positions shown; findings below may reference images not displayed]

ACR Breast Density Category c: The breast tissue is heterogeneously
dense, which may obscure small masses.
FINDINGS: Interval post excisional changes within the anterior slightly
lateral left breast. No new masses, calcifications or nonsurgical
distortion identified.
IMPRESSION: No suspicious findings left breast. Interval post excision changes
anterolateral left breast.

RECOMMENDATION:
Return to annual screening mammography [DATE].

I have discussed the findings and recommendations with the patient.
If applicable, a reminder letter will be sent to the patient
regarding the next appointment.

BI-RADS CATEGORY  2: Benign.

## 2023-02-10 ENCOUNTER — Other Ambulatory Visit (INDEPENDENT_AMBULATORY_CARE_PROVIDER_SITE_OTHER): Payer: Self-pay

## 2023-02-10 DIAGNOSIS — K219 Gastro-esophageal reflux disease without esophagitis: Secondary | ICD-10-CM

## 2023-02-10 MED ORDER — OMEPRAZOLE 40 MG PO CPDR: 40.0000 mg | DELAYED_RELEASE_CAPSULE | Freq: Every day | ORAL | 3 refills | Status: DC

## 2023-02-11 ENCOUNTER — Encounter (INDEPENDENT_AMBULATORY_CARE_PROVIDER_SITE_OTHER): Payer: Self-pay | Admitting: Gastroenterology

## 2023-04-09 ENCOUNTER — Ambulatory Visit (INDEPENDENT_AMBULATORY_CARE_PROVIDER_SITE_OTHER): Payer: Medicare Other | Admitting: Gastroenterology

## 2023-06-15 ENCOUNTER — Ambulatory Visit (INDEPENDENT_AMBULATORY_CARE_PROVIDER_SITE_OTHER): Payer: Medicare Other | Admitting: Gastroenterology

## 2023-06-29 ENCOUNTER — Ambulatory Visit (INDEPENDENT_AMBULATORY_CARE_PROVIDER_SITE_OTHER): Payer: Medicare Other | Admitting: Gastroenterology

## 2023-07-06 ENCOUNTER — Ambulatory Visit (INDEPENDENT_AMBULATORY_CARE_PROVIDER_SITE_OTHER): Payer: Medicare Other | Admitting: Gastroenterology

## 2023-07-06 ENCOUNTER — Encounter (INDEPENDENT_AMBULATORY_CARE_PROVIDER_SITE_OTHER): Payer: Self-pay | Admitting: Gastroenterology

## 2023-07-06 VITALS — BP 134/81 | HR 76 | Temp 98.1°F | Ht 60.0 in | Wt 110.7 lb

## 2023-07-06 DIAGNOSIS — Z8619 Personal history of other infectious and parasitic diseases: Secondary | ICD-10-CM | POA: Insufficient documentation

## 2023-07-06 DIAGNOSIS — Z1159 Encounter for screening for other viral diseases: Secondary | ICD-10-CM

## 2023-07-06 DIAGNOSIS — K219 Gastro-esophageal reflux disease without esophagitis: Secondary | ICD-10-CM

## 2023-07-06 MED ORDER — OMEPRAZOLE 20 MG PO CPDR: 20.0000 mg | DELAYED_RELEASE_CAPSULE | Freq: Every day | ORAL | 3 refills | Status: DC

## 2023-07-06 NOTE — Progress Notes (Signed)
Stephanie [name], M.D. Gastroenterology & Hepatology [facility] [address] [place], [place] [address]  Primary Care Physician: Georgianne Fick, MD [address] [place] [address]  I will communicate my assessment and recommendations to the referring MD via EMR.  Problems: GERD Unclear history of chronic hepatitis B  History of Present Illness: Stephanie [name] is a 67 y.o. female from [place] with past medical history of ?hepatitis B, COPD, hypertension, emphysema, constipation, GERD, who presents for follow up of hepatitis B and GERD.  The patient was last seen on 04/10/2022. At that time, the patient was advised to take omeprazole 40 mg every day.  It was unclear if she indeed had hepatitis B and was ordered to have serologies checked but this was not performed.  Patient denies having any heartburn or dysphagia. She take omeprazole 40 mg qday at 5 PM. She reports this controls her regurgitation.  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  She does not know if she indeed has this infection or not.  Also states that multiple people she knew in the past while living in [place] had hepatitis B.  The patient denies having any nausea, vomiting, fever, chills, hematochezia, melena, hematemesis, abdominal distention, abdominal pain, diarrhea, jaundice, pruritus or weight loss.  Last Colonoscopy:01/03/22 The examined portion of the ileum was normal. - Diverticulosis in the sigmoid colon. - Non-bleeding internal hemorrhoids. - No specimens collected.  Last Endoscopy:4/14/23Z-line regular, 35 cm from the incisors. - Normal esophagus. - Erosive gastropathy with stigmata of recent bleeding. Biopsied.-normal  Normal examined duodenum   Recommendations:  Repeat colonoscopy in 10 years  Past Medical History: Past Medical History:   Diagnosis Date   COPD (chronic obstructive pulmonary disease) ([facility])    quit smoking 8 yrs ago   COVID 2021   no complications per patient   Hypertension    Positive TB test many yrs ago   Right ovarian cyst    Sinusitis    occ    Past Surgical History: Past Surgical History:  Procedure Laterality Date   BIOPSY  01/03/2022   Procedure: BIOPSY;  Surgeon: Dolores Frame, MD;  [facility];  Service: Gastroenterology;;   bronscopy  2000   CATARACT EXTRACTION W/PHACO Left 08/23/2021   Procedure: CATARACT EXTRACTION PHACO AND INTRAOCULAR LENS PLACEMENT ([facility]);  Surgeon: Fabio Pierce, MD;  [facility]: [facility];  Service: Ophthalmology;  Laterality: Left;  CDE 73.40   COLONOSCOPY WITH PROPOFOL N/A 01/03/2022   Procedure: COLONOSCOPY WITH PROPOFOL;  Surgeon: Dolores Frame, MD;  [facility];  Service: Gastroenterology;  Laterality: N/A;  730   colonscopy  2013   ESOPHAGOGASTRODUODENOSCOPY (EGD) WITH PROPOFOL N/A 01/03/2022   Procedure: ESOPHAGOGASTRODUODENOSCOPY (EGD) WITH PROPOFOL;  Surgeon: Dolores Frame, MD;  [facility];  Service: Gastroenterology;  Laterality: N/A;   HYSTEROSCOPY N/A 08/26/2019   Procedure: HYSTEROSCOPY;  Surgeon: Lavina Hamman, MD;  Location: [facility];  Service: Gynecology;  Laterality: N/A;   LAPAROSCOPIC UNILATERAL SALPINGO OOPHERECTOMY Right 08/26/2019   Procedure: LAPAROSCOPIC BILATERAL SALPINGO OOPHORECTOMY;  Surgeon: Lavina Hamman, MD;  Location: [facility];  Service: Gynecology;  Laterality: Right;   RADIOACTIVE SEED GUIDED EXCISIONAL BREAST BIOPSY Left 10/11/2020   Procedure: LEFT BREAST RADIOACTIVE SEED GUIDED EXCISIONAL BREAST BIOPSY;  Surgeon: Emelia Loron, MD;  Location: [facility];  Service:  General;  Laterality: Left;    Family History: Family History  Problem Relation Age of Onset   Breast cancer Sister     Social History: Social  History   Tobacco Use  Smoking Status Former   Current packs/day: 1.00   Average packs/day: 1 pack/day for 50.0 years (50.0 ttl pk-yrs)   Types: Cigarettes   Passive exposure: Never  Smokeless Tobacco Never  Tobacco Comments   quit 8 yrs ago   Social History   Substance and Sexual Activity  Alcohol Use Yes   Comment: occ   Social History   Substance and Sexual Activity  Drug Use Never    Allergies: No Known Allergies  Medications: Current Outpatient Medications  Medication Sig Dispense Refill   acetaminophen (TYLENOL) 325 MG tablet Take 650 mg by mouth every 6 (six) hours as needed for mild pain or moderate pain.     Multiple Vitamin (MULTIVITAMIN WITH MINERALS) TABS tablet Take 1 tablet by mouth every evening.     omeprazole (PRILOSEC) 40 MG capsule Take 1 capsule (40 mg total) by mouth daily. 90 capsule 3   potassium chloride (KLOR-CON) 10 MEQ tablet Take 20 mEq by mouth daily.     senna (SENOKOT) 8.6 MG tablet Take 1 tablet by mouth daily as needed for constipation.     triamterene-hydrochlorothiazide (MAXZIDE) 75-50 MG tablet Take 0.5 tablets by mouth every evening.     No current facility-administered medications for this visit.    Review of Systems: GENERAL: negative for malaise, night sweats HEENT: No changes in hearing or vision, no nose bleeds or other nasal problems. NECK: Negative for lumps, goiter, pain and significant neck swelling RESPIRATORY: Negative for cough, wheezing CARDIOVASCULAR: Negative for chest pain, leg swelling, palpitations, orthopnea GI: SEE HPI MUSCULOSKELETAL: Negative for joint pain or swelling, back pain, and muscle pain. SKIN: Negative for lesions, rash PSYCH: Negative for sleep disturbance, mood disorder and recent psychosocial stressors. HEMATOLOGY Negative for prolonged bleeding, bruising easily, and swollen nodes. ENDOCRINE: Negative for cold or heat intolerance, polyuria, polydipsia and goiter. NEURO: negative for tremor,  gait imbalance, syncope and seizures. The remainder of the review of systems is noncontributory.   Physical Exam: BP 134/81 (BP Location: Left Arm, Patient Position: Sitting, Cuff Size: Normal)   Pulse 76   Temp 98.1 F (36.7 C) (Oral)   Ht 5' (1.524 m)   Wt 110 lb 11.2 oz (50.2 kg)   BMI 21.62 kg/m  GENERAL: The patient is AO x3, in no acute distress. HEENT: Head is normocephalic and atraumatic. EOMI are intact. Mouth is well hydrated and without lesions. NECK: Supple. No masses LUNGS: Clear to auscultation. No presence of rhonchi/wheezing/rales. Adequate chest expansion HEART: RRR, normal s1 and s2. ABDOMEN: Soft, nontender, no guarding, no peritoneal signs, and nondistended. BS +. No masses. EXTREMITIES: Without any cyanosis, clubbing, rash, lesions or edema. NEUROLOGIC: AOx3, no focal motor deficit. SKIN: no jaundice, no rashes  Imaging/Labs: as above  I personally reviewed and interpreted the available labs, imaging and endoscopic files.  Impression and Plan: Stephanie [name] is a 67 y.o. female from [place] with past medical history of ?hepatitis B, COPD, hypertension, emphysema, constipation, GERD, who presents for follow up of hepatitis B and GERD.  The patient has presented adequate control of her GERD and chest pain while taking medium dose omeprazole on a daily basis.  As she has presented significant control of her symptoms, we will try decreasing her omeprazole to 20 mg every day.  The patient  and I held a thorough discussion about potential nonpharmacologic treatments for reflux such as transoral Incisionless Fundoplication (TIF).  The details of the procedure, benefits and risks, as well as prognosis with this intervention was thoroughly discussed with the patient who understood and agreed.  Dietary modifications and post procedural recommendations were also discussed the patient.  The patient will read more about this procedure. Pamphlet provided.  She will reach [place]  if she is interested in proceeding with this.  Finally, she has an unclear history of hepatitis B.  We will obtain serologies to confirm if she indeed has it or not, but also if she needs to have treatment for this.  -Check CBC, CMP, hepatitis A and B serologies - Decrease omeprazole to 20 mg every day - If chest pain or heartburn come back, go back to omeprazole 40 mg every day  All questions were answered.      Stephanie Blazing, MD Gastroenterology and Hepatology [facility]

## 2023-07-06 NOTE — Patient Instructions (Addendum)
Perform blood workup Decrease omeprazole to 20 mg every day If chest pain or heartburn come back, go back to omeprazole 40 mg every day The patient and I held a thorough discussion about potential nonpharmacologic treatments for reflux such as transoral Incisionless Fundoplication (TIF).  The details of the procedure, benefits and risks, as well as prognosis with this intervention was thoroughly discussed with the patient who understood and agreed.  Dietary modifications and post procedural recommendations were also discussed the patient.  The patient will read more about this procedure Pamphlet provided.

## 2023-07-09 LAB — CBC WITH DIFFERENTIAL/PLATELET
Absolute Monocytes: 242 {cells}/uL (ref 200–950)
Basophils Absolute: 89 {cells}/uL (ref 0–200)
Basophils Relative: 1.5 %
Eosinophils Absolute: 177 {cells}/uL (ref 15–500)
Eosinophils Relative: 3 %
HCT: 46.4 % — ABNORMAL HIGH (ref 35.0–45.0)
Hemoglobin: 14.9 g/dL (ref 11.7–15.5)
Lymphs Abs: 1168 {cells}/uL (ref 850–3900)
MCH: 29.2 pg (ref 27.0–33.0)
MCHC: 32.1 g/dL (ref 32.0–36.0)
MCV: 91 fL (ref 80.0–100.0)
MPV: 10.6 fL (ref 7.5–12.5)
Monocytes Relative: 4.1 %
Neutro Abs: 4224 {cells}/uL (ref 1500–7800)
Neutrophils Relative %: 71.6 %
Platelets: 219 10*3/uL (ref 140–400)
RBC: 5.1 10*6/uL (ref 3.80–5.10)
RDW: 12 % (ref 11.0–15.0)
Total Lymphocyte: 19.8 %
WBC: 5.9 10*3/uL (ref 3.8–10.8)

## 2023-07-09 LAB — HEPATITIS B E ANTIBODY: Hep B E Ab: REACTIVE — AB

## 2023-07-09 LAB — COMPREHENSIVE METABOLIC PANEL
AG Ratio: 1.9 (calc) (ref 1.0–2.5)
ALT: 13 U/L (ref 6–29)
AST: 18 U/L (ref 10–35)
Albumin: 4.6 g/dL (ref 3.6–5.1)
Alkaline phosphatase (APISO): 76 U/L (ref 37–153)
BUN: 16 mg/dL (ref 7–25)
CO2: 30 mmol/L (ref 20–32)
Calcium: 9.6 mg/dL (ref 8.6–10.4)
Chloride: 97 mmol/L — ABNORMAL LOW (ref 98–110)
Creat: 0.91 mg/dL (ref 0.50–1.05)
Globulin: 2.4 g/dL (ref 1.9–3.7)
Glucose, Bld: 156 mg/dL — ABNORMAL HIGH (ref 65–99)
Potassium: 3.3 mmol/L — ABNORMAL LOW (ref 3.5–5.3)
Sodium: 137 mmol/L (ref 135–146)
Total Bilirubin: 0.7 mg/dL (ref 0.2–1.2)
Total Protein: 7 g/dL (ref 6.1–8.1)

## 2023-07-09 LAB — HEPATITIS B E ANTIGEN: Hep B E Ag: NONREACTIVE

## 2023-07-09 LAB — HEPATITIS B DNA, ULTRAQUANTITATIVE, PCR
Hepatitis B DNA: NOT DETECTED [IU]/mL
Hepatitis B virus DNA: NOT DETECTED {Log}

## 2023-07-09 LAB — HEPATITIS B SURFACE ANTIGEN: Hepatitis B Surface Ag: NONREACTIVE

## 2023-07-09 LAB — HEPATITIS A ANTIBODY, TOTAL: Hepatitis A AB,Total: REACTIVE — AB

## 2023-07-09 LAB — HEPATITIS B SURFACE ANTIBODY,QUALITATIVE: Hep B S Ab: REACTIVE — AB

## 2023-07-09 LAB — HEPATITIS B CORE ANTIBODY, TOTAL: Hep B Core Total Ab: REACTIVE — AB

## 2023-11-26 ENCOUNTER — Other Ambulatory Visit: Payer: Self-pay | Admitting: Internal Medicine

## 2023-11-26 DIAGNOSIS — Z1231 Encounter for screening mammogram for malignant neoplasm of breast: Secondary | ICD-10-CM

## 2023-12-03 ENCOUNTER — Ambulatory Visit
Admission: RE | Admit: 2023-12-03 | Discharge: 2023-12-03 | Disposition: A | Discharge status: Home / Self Care | Source: Ambulatory Visit | Attending: Internal Medicine | Admitting: Internal Medicine

## 2023-12-03 DIAGNOSIS — Z1231 Encounter for screening mammogram for malignant neoplasm of breast: Secondary | ICD-10-CM

## 2024-04-03 ENCOUNTER — Other Ambulatory Visit (INDEPENDENT_AMBULATORY_CARE_PROVIDER_SITE_OTHER): Payer: Self-pay | Admitting: Gastroenterology

## 2024-04-03 DIAGNOSIS — K219 Gastro-esophageal reflux disease without esophagitis: Secondary | ICD-10-CM

## 2024-07-04 ENCOUNTER — Ambulatory Visit (INDEPENDENT_AMBULATORY_CARE_PROVIDER_SITE_OTHER): Payer: Medicare Other | Admitting: Gastroenterology

## 2024-07-06 ENCOUNTER — Encounter (INDEPENDENT_AMBULATORY_CARE_PROVIDER_SITE_OTHER): Payer: Self-pay | Admitting: Gastroenterology

## 2024-07-18 ENCOUNTER — Ambulatory Visit (INDEPENDENT_AMBULATORY_CARE_PROVIDER_SITE_OTHER): Admitting: Gastroenterology

## 2024-07-18 ENCOUNTER — Encounter (INDEPENDENT_AMBULATORY_CARE_PROVIDER_SITE_OTHER): Payer: Self-pay | Admitting: Gastroenterology

## 2024-07-18 VITALS — BP 143/91 | HR 86 | Temp 97.5°F | Ht 60.0 in | Wt 114.4 lb

## 2024-07-18 DIAGNOSIS — K219 Gastro-esophageal reflux disease without esophagitis: Secondary | ICD-10-CM

## 2024-07-18 DIAGNOSIS — Z8619 Personal history of other infectious and parasitic diseases: Secondary | ICD-10-CM | POA: Diagnosis not present

## 2024-07-18 MED ORDER — FAMOTIDINE 40 MG PO TABS: 40.0000 mg | ORAL_TABLET | Freq: Every day | ORAL | 3 refills | Status: AC

## 2024-07-18 NOTE — Patient Instructions (Signed)
 Stop omeprazole  Start famotidine 40 mg daily Explained presumed etiology of reflux symptoms. Instruction provided in the use of antireflux medication - patient should take medication in the morning 30-45 minutes before eating breakfast. Discussed avoidance of eating within 2 hours of lying down to sleep and benefit of blocks to elevate head of bed. Also, will benefit from avoiding carbonated drinks/sodas or food that has tomatoes, spicy or greasy food.

## 2024-07-18 NOTE — Progress Notes (Signed)
 Toribio Fortune, M.D. Gastroenterology & Hepatology [facility] [address] [place], [place] [address]  Primary Care Physician: Verdia Lombard, MD [address] [place] [address]  I will communicate my assessment and recommendations to the referring MD via EMR.  Problems: GERD Chronic hepatitis B status post spontaneous clearance   History of Present Illness: Stephanie [name] is a 68 y.o. female from [place] with past medical chronic hepatitis B status post spontaneous clearance, COPD, hypertension, emphysema, constipation, GERD, who presents for follow up of hepatitis B and GERD.  The patient was last seen on 07/06/2023. At that time, the patient had her omeprazole  decreased to 20 mg daily.  As it was unclear if she had hepatitis B infection, she had serologies checked which showed positive hepatitis B core and surface antibody, as well as E antibody suggestive of previous infection that had cleared up.  Also had positive hepatitis A immunity.  Patient reports that once a week she may have heartburn. She only takes omeprazole  when she has burning in her chest. However, she does not take her PPI daily but only as needed due to concern for potential long term side effects. No dysphagia although has some throat discomfort occasionally. The patient denies having any nausea, vomiting, fever, chills, hematochezia, melena, hematemesis, abdominal distention, abdominal pain, diarrhea, jaundice, pruritus or weight loss.  Last Colonoscopy:01/03/22 The examined portion of the ileum was normal. - Diverticulosis in the sigmoid colon. - Non-bleeding internal hemorrhoids. - No specimens collected.   Last Endoscopy:4/14/23Z-line regular, 35 cm from the incisors. - Normal esophagus. - Erosive gastropathy with stigmata of recent bleeding. Biopsied.-normal  Normal examined duodenum  Past Medical History: Past Medical History:   Diagnosis Date   COPD (chronic obstructive pulmonary disease) (HCC)    quit smoking 8 yrs ago   COVID 2021   no complications per patient   Hypertension    Positive TB test many yrs ago   Right ovarian cyst    Sinusitis    occ    Past Surgical History: Past Surgical History:  Procedure Laterality Date   BIOPSY  01/03/2022   Procedure: BIOPSY;  Surgeon: Fortune Angelia Toribio, MD;  Location: [facility];  Service: Gastroenterology;;   BREAST EXCISIONAL BIOPSY Left    bronscopy  2000   CATARACT EXTRACTION W/PHACO Left 08/23/2021   Procedure: CATARACT EXTRACTION PHACO AND INTRAOCULAR LENS PLACEMENT (IOC);  Surgeon: Harrie Agent, MD;  Location: [facility];  Service: Ophthalmology;  Laterality: Left;  CDE 73.40   COLONOSCOPY WITH PROPOFOL  N/A 01/03/2022   Procedure: COLONOSCOPY WITH PROPOFOL ;  Surgeon: Fortune Angelia Toribio, MD;  Location: [facility];  Service: Gastroenterology;  Laterality: N/A;  730   colonscopy  2013   ESOPHAGOGASTRODUODENOSCOPY (EGD) WITH PROPOFOL  N/A 01/03/2022   Procedure: ESOPHAGOGASTRODUODENOSCOPY (EGD) WITH PROPOFOL ;  Surgeon: Fortune Angelia Toribio, MD;  Location: [facility];  Service: Gastroenterology;  Laterality: N/A;   HYSTEROSCOPY N/A 08/26/2019   Procedure: HYSTEROSCOPY;  Surgeon: Horacio Boas, MD;  Location: [facility];  Service: Gynecology;  Laterality: N/A;   LAPAROSCOPIC UNILATERAL SALPINGO OOPHERECTOMY Right 08/26/2019   Procedure: LAPAROSCOPIC BILATERAL SALPINGO OOPHORECTOMY;  Surgeon: Horacio Boas, MD;  Location: [facility];  Service: Gynecology;  Laterality: Right;   RADIOACTIVE SEED GUIDED EXCISIONAL BREAST BIOPSY Left 10/11/2020   Procedure: LEFT BREAST RADIOACTIVE SEED GUIDED EXCISIONAL BREAST BIOPSY;  Surgeon: Ebbie Cough, MD;  Location: [facility];  Service: General;  Laterality: Left;  Family History: Family History  Problem Relation Age of Onset   Breast  cancer Sister    Breast cancer Sister     Social History: Social History   Tobacco Use  Smoking Status Former   Current packs/day: 1.00   Average packs/day: 1 pack/day for 50.0 years (50.0 ttl pk-yrs)   Types: Cigarettes   Passive exposure: Never  Smokeless Tobacco Never  Tobacco Comments   quit 8 yrs ago   Social History   Substance and Sexual Activity  Alcohol Use Yes   Comment: occ   Social History   Substance and Sexual Activity  Drug Use Never    Allergies: No Known Allergies  Medications: Current Outpatient Medications  Medication Sig Dispense Refill   acetaminophen  (TYLENOL ) 325 MG tablet Take 650 mg by mouth every 6 (six) hours as needed for mild pain or moderate pain.     Multiple Vitamin (MULTIVITAMIN WITH MINERALS) TABS tablet Take 1 tablet by mouth every evening.     omeprazole  (PRILOSEC) 20 MG capsule TAKE 1 CAPSULE(20 MG) BY MOUTH DAILY 90 capsule 3   potassium chloride  (KLOR-CON ) 10 MEQ tablet Take 20 mEq by mouth daily.     senna (SENOKOT) 8.6 MG tablet Take 1 tablet by mouth daily as needed for constipation.     triamterene-hydrochlorothiazide (MAXZIDE) 75-50 MG tablet Take 0.5 tablets by mouth every evening.     No current facility-administered medications for this visit.    Review of Systems: GENERAL: negative for malaise, night sweats HEENT: No changes in hearing or vision, no nose bleeds or other nasal problems. NECK: Negative for lumps, goiter, pain and significant neck swelling RESPIRATORY: Negative for cough, wheezing CARDIOVASCULAR: Negative for chest pain, leg swelling, palpitations, orthopnea GI: SEE HPI MUSCULOSKELETAL: Negative for joint pain or swelling, back pain, and muscle pain. SKIN: Negative for lesions, rash PSYCH: Negative for sleep disturbance, mood disorder and recent psychosocial stressors. HEMATOLOGY Negative for prolonged bleeding, bruising easily, and swollen nodes. ENDOCRINE: Negative for cold or heat intolerance,  polyuria, polydipsia and goiter. NEURO: negative for tremor, gait imbalance, syncope and seizures. The remainder of the review of systems is noncontributory.   Physical Exam: BP (!) 143/91 (BP Location: Left Arm, Patient Position: Sitting, Cuff Size: Normal)   Pulse 86   Temp (!) 97.5 F (36.4 C) (Temporal)   Ht 5' (1.524 m)   Wt 114 lb 6.4 oz (51.9 kg)   BMI 22.34 kg/m  GENERAL: The patient is AO x3, in no acute distress. HEENT: Head is normocephalic and atraumatic. EOMI are intact. Mouth is well hydrated and without lesions. NECK: Supple. No masses LUNGS: Clear to auscultation. No presence of rhonchi/wheezing/rales. Adequate chest expansion HEART: RRR, normal s1 and s2. ABDOMEN: Soft, nontender, no guarding, no peritoneal signs, and nondistended. BS +. No masses. EXTREMITIES: Without any cyanosis, clubbing, rash, lesions or edema. NEUROLOGIC: AOx3, no focal motor deficit. SKIN: no jaundice, no rashes  Imaging/Labs: as above  I personally reviewed and interpreted the available labs, imaging and endoscopic files.  Impression and Plan: Stephanie [name] is a 68 y.o. female from [place] with past medical chronic hepatitis B status post spontaneous clearance, COPD, hypertension, emphysema, constipation, GERD, who presents for follow up of hepatitis B and GERD.  The patient has presented intermittent episodes of GERD.  She has not been taking any PPI due to concerns of side effects from medication. Patient was re-assured regarding PPI use and safety of when appropriate indications in question. Most recent studies on PPI  therapy that association of symptoms is not equivalent to causation and overall association with for example osteoporosis is weak and based on observational studies. When PPI use is indicated, it is safe to proceed with therapy and titrate dosing/use based on symptom response.  The patient stated that she would like to try medication that has lower risk of side effects,  especially as she is having symptoms once a week or less.  We will try famotidine on a regular basis for now.  Notably, the patient had a history of hepatitis B.  Serology showed spontaneous clearance of this infection.  No further management is warranted for this.  -Stop omeprazole  -Start famotidine 40 mg daily -Explained presumed etiology of reflux symptoms. Instruction provided in the use of antireflux medication - patient should take medication in the morning 30-45 minutes before eating breakfast. Discussed avoidance of eating within 2 hours of lying down to sleep and benefit of blocks to elevate head of bed. Also, will benefit from avoiding carbonated drinks/sodas or food that has tomatoes, spicy or greasy food.  All questions were answered.      Toribio Fortune, MD Gastroenterology and Hepatology [facility]
